# Patient Record
Sex: Female | Born: 1990 | Race: White | Hispanic: No | Marital: Married
Health system: Southern US, Community
[De-identification: ages and names within clinical notes are randomized; demographics above are authoritative.]

## PROBLEM LIST (undated history)

## (undated) HISTORY — PX: FRACTURE SURGERY: SHX138

## (undated) HISTORY — PX: SHOULDER SURGERY: SHX246

---
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
## 2012-11-27 ENCOUNTER — Encounter: Payer: Self-pay | Admitting: Obstetrics and Gynecology

## 2012-12-07 ENCOUNTER — Encounter (HOSPITAL_COMMUNITY): Payer: Self-pay | Admitting: Emergency Medicine

## 2012-12-07 ENCOUNTER — Emergency Department (HOSPITAL_COMMUNITY)
Admission: EM | Admit: 2012-12-07 | Discharge: 2012-12-07 | Disposition: A | Discharge status: Home / Self Care | Payer: No Typology Code available for payment source | Attending: Emergency Medicine | Admitting: Emergency Medicine

## 2012-12-07 DIAGNOSIS — Z23 Encounter for immunization: Secondary | ICD-10-CM | POA: Insufficient documentation

## 2012-12-07 DIAGNOSIS — Y9301 Activity, walking, marching and hiking: Secondary | ICD-10-CM | POA: Insufficient documentation

## 2012-12-07 DIAGNOSIS — Y92410 Unspecified street and highway as the place of occurrence of the external cause: Secondary | ICD-10-CM | POA: Insufficient documentation

## 2012-12-07 DIAGNOSIS — S41109A Unspecified open wound of unspecified upper arm, initial encounter: Secondary | ICD-10-CM | POA: Insufficient documentation

## 2012-12-07 DIAGNOSIS — T148XXA Other injury of unspecified body region, initial encounter: Secondary | ICD-10-CM

## 2012-12-07 DIAGNOSIS — W540XXA Bitten by dog, initial encounter: Secondary | ICD-10-CM | POA: Insufficient documentation

## 2012-12-07 DIAGNOSIS — S8990XA Unspecified injury of unspecified lower leg, initial encounter: Secondary | ICD-10-CM | POA: Insufficient documentation

## 2012-12-07 DIAGNOSIS — Z79899 Other long term (current) drug therapy: Secondary | ICD-10-CM | POA: Insufficient documentation

## 2012-12-07 DIAGNOSIS — S99929A Unspecified injury of unspecified foot, initial encounter: Secondary | ICD-10-CM | POA: Insufficient documentation

## 2012-12-07 MED ORDER — TETANUS-DIPHTH-ACELL PERTUSSIS 5-2.5-18.5 LF-MCG/0.5 IM SUSP
0.5000 mL | Freq: Once | INTRAMUSCULAR | Status: AC
  Administered 2012-12-07: 0.5 mL via INTRAMUSCULAR
  Filled 2012-12-07: qty 0.5

## 2012-12-07 MED ORDER — AMOXICILLIN-POT CLAVULANATE 875-125 MG PO TABS: 1.0000 | ORAL_TABLET | Freq: Two times a day (BID) | ORAL | Status: DC

## 2012-12-07 NOTE — ED Provider Notes (Signed)
History    This chart was scribed for non-physician practitioner working with Jocelyn Roots, MD by Jocelyn Harrison, ED Scribe. This patient was seen in room TR10C/TR10C and the patient's care was started at 2111.   CSN: [id number]  Arrival date & time 12/07/12  2003   First MD Initiated Contact with Patient 12/07/12 2111      Chief Complaint  Patient presents with  . Animal Bite    The history is provided by the patient. No language interpreter was used.   Jocelyn Harrison is a 22 y.o. female who presents to the Emergency Department complaining of sudden onset, constant, left and right hand pain and right leg pain, unchanged since onset, due to being bitten by a neighbor's dog immediately before arrival. Pt states she was walking down the street and the dog ran up and started biting her. She reports the dog has already been detained by [employer]. She states that there was minimal bleeding and denies aggravating or alleviating factors of symptoms. Denies lightheadedness, dizziness, pallor, and numbness or tingling in her extremities.  Date of last tetanus unknown.  History reviewed. No pertinent past medical history.  No past surgical history on file.  No family history on file.  History  Substance Use Topics  . Smoking status: Never Smoker   . Smokeless tobacco: Not on file  . Alcohol Use: No     Review of Systems  Constitutional: Negative for fever, chills and diaphoresis.  Musculoskeletal: Positive for joint swelling.  Skin: Positive for wound. Negative for pallor.  Neurological: Negative for weakness and numbness.  All other systems reviewed and are negative.    Allergies  Lactose intolerance (gi)  Home Medications   Current Outpatient Rx  Name  Route  Sig  Dispense  Refill  . ibuprofen (ADVIL,MOTRIN) 200 MG tablet   Oral   Take 400 mg by mouth every 6 (six) hours as needed for pain.         Marland Kitchen neomycin-bacitracin-polymyxin (NEOSPORIN) ointment   Topical    Apply 1 application topically 2 (two) times daily as needed (to animal bite).         . Norgestimate-Ethinyl Estradiol Triphasic (TRI-PREVIFEM) 0.18/0.215/0.25 MG-35 MCG tablet   Oral   Take 1 tablet by mouth daily.         Marland Kitchen amoxicillin-clavulanate (AUGMENTIN) 875-125 MG per tablet   Oral   Take 1 tablet by mouth every 12 (twelve) hours.   14 tablet   0     BP 140/77  Pulse 92  Temp(Src) 98 F (36.7 C) (Oral)  Resp 14  SpO2 99%  LMP 10/22/2012  Physical Exam  Nursing note and vitals reviewed. Constitutional: She is oriented to person, place, and time. She appears well-developed and well-nourished. No distress.  HENT:  Head: Normocephalic and atraumatic.  Eyes: Conjunctivae and EOM are normal. No scleral icterus.  Neck: Neck supple. No tracheal deviation present.  Cardiovascular: Normal rate, regular rhythm, normal heart sounds and intact distal pulses.   Capillary refill <3 seconds in all extremities.  Pulmonary/Chest: Effort normal and breath sounds normal. No respiratory distress. She has no wheezes. She has no rales.  Musculoskeletal: Normal range of motion.  Full ROM of b/l upper extremities and hands. Patient able to make tight fist and has 5/5 strength against resistance of FDP, FDS, and extensors of all fingers on b/l hands.  Neurological: She is alert and oriented to person, place, and time. She has normal reflexes.  No sensory or motor deficits appreciated.  Skin: Skin is warm and dry. She is not diaphoretic. No pallor.  Superficial punctate markings to proximal aspect of 5th finger on left hand as well as dorsal surface of right hand. Abrasion on tibial aspect of right leg distal to right knee. Mild tenderness on palpation at these bite sites.  Psychiatric: She has a normal mood and affect. Her behavior is normal.    ED Course  Procedures (including critical care time) DIAGNOSTIC STUDIES: Oxygen Saturation is 99% on room air, normal by my interpretation.     COORDINATION OF CARE: 9:36 PM Discussed treatment plan which includes dressing the wound, a tetanus shot, Augmentin, ice and Ibuprofen with pt at bedside and pt agreed to plan.  Advised pt to follow up with Animal Control regarding the dogs rabies status.   Labs Reviewed - No data to display No results found.   1. Animal bite      MDM  Uncomplicated dog bite on L 5th finger, R hand and right knee. Patient neurovascularly intact with full ROM of all extremities and normal, equal muscle strength. Patient given tetanus in ED. Well and nontoxic appearing, in NAD; stable for d/c with PCP follow up and course of Augmentin. Patient and mother instructed to follow up with animal control regarding rabies status of dog to see if rabies tx necessary. Indications for ED return discussed. Patient and mother state comfort and understanding with d/c plan with no unaddressed concerns.   I personally performed the services described in this documentation, which was scribed in my presence. The recorded information has been reviewed and is accurate.          Jocelyn Madura, PA-C 12/09/12 2245

## 2012-12-07 NOTE — ED Notes (Signed)
PT. REPORTS NEIGHBORS' DOG BIT HER AT RIGHT LOWER KNEE AND BILATERAL HANDS THIS EVENING , BITE MARKS NOTED AT RIGHT LOWER KNEE AND BILATERAL HANDS , NO BLEEDING . PT. UNSURE OF HER TETANUS IMMUNIZATION.

## 2012-12-12 ENCOUNTER — Ambulatory Visit: Payer: Self-pay | Admitting: Gynecology

## 2012-12-12 NOTE — ED Provider Notes (Signed)
Medical screening examination/treatment/procedure(s) were performed by non-physician practitioner and as supervising physician I was immediately available for consultation/collaboration.   Suzi Roots, MD 12/12/12 [phone]

## 2012-12-20 ENCOUNTER — Encounter: Payer: Self-pay | Admitting: Gynecology

## 2012-12-20 ENCOUNTER — Ambulatory Visit (INDEPENDENT_AMBULATORY_CARE_PROVIDER_SITE_OTHER): Payer: No Typology Code available for payment source

## 2012-12-20 ENCOUNTER — Ambulatory Visit (INDEPENDENT_AMBULATORY_CARE_PROVIDER_SITE_OTHER): Payer: No Typology Code available for payment source | Admitting: Gynecology

## 2012-12-20 VITALS — BP 116/70 | Ht 64.0 in | Wt 129.0 lb

## 2012-12-20 DIAGNOSIS — N94819 Vulvodynia, unspecified: Secondary | ICD-10-CM | POA: Insufficient documentation

## 2012-12-20 DIAGNOSIS — IMO0001 Reserved for inherently not codable concepts without codable children: Secondary | ICD-10-CM

## 2012-12-20 DIAGNOSIS — N949 Unspecified condition associated with female genital organs and menstrual cycle: Secondary | ICD-10-CM

## 2012-12-20 DIAGNOSIS — R102 Pelvic and perineal pain: Secondary | ICD-10-CM

## 2012-12-20 DIAGNOSIS — Z309 Encounter for contraceptive management, unspecified: Secondary | ICD-10-CM

## 2012-12-20 LAB — URINALYSIS W MICROSCOPIC + REFLEX CULTURE
Bilirubin Urine: NEGATIVE
Casts: NONE SEEN
Crystals: NONE SEEN
Ketones, ur: NEGATIVE mg/dL
Specific Gravity, Urine: 1.01 (ref 1.005–1.030)
pH: 7 (ref 5.0–8.0)

## 2012-12-20 MED ORDER — NORTRIPTYLINE HCL 10 MG PO CAPS: ORAL_CAPSULE | ORAL | Status: DC

## 2012-12-20 MED ORDER — LIDOCAINE HCL 2 % EX GEL: CUTANEOUS | Status: DC | PRN

## 2012-12-20 MED ORDER — ETONOGESTREL-ETHINYL ESTRADIOL 0.12-0.015 MG/24HR VA RING: VAGINAL_RING | VAGINAL | Status: DC

## 2012-12-20 NOTE — Progress Notes (Signed)
Patient is a 22 year old that was referred to our practice as a courtesy of Tomi Bamberger, DNP,FNP-BC as a result of this patient's continued pelvic discomfort. Patient was treated for suspected vaginal infection and had been placed on Flagyl 500 mg twice a day for 10 days and also been given Levaquin 500 mg one tablet daily for 10 days as well. Patient also had been prescribed for vaginal candidiasis fluconazole 150 mg one tablet daily for 5 days.  Patient states that her symptoms are during the initial penetration during intercourse and is also very sensitive and tender on the outside of the vulva. She states that when she voids it hurts but on further questioning it appears when she wipes and not actually true dysuria. She is on a triphasic oral contraceptive pill and is also been using condoms.  Exam: Abdomen: Soft slightly tender suprapubic no rebound or guarding Bartholin urethra Skene glands: Within normal limits Q-tip test external labia minora and majora very sensitive on contact Vagina: No lesions or discharge Cervix: No lesions or discharge Bimanual exam: Somewhat difficult and tender and difficult to assess her adnexa. Rectal exam: Not done  Patient is in the process starting her menses. Urinalysis today was negative with exception of small hemoglobin.  Ultrasound: Uterus measures 7.0 x 4.2 x 2.7 cm with endometrial stripe of 3.9 mm. Both right and left ovary were otherwise normal no masses were noted on either adnexa.  Assessment/plan: Vulvovestibulitis etiology? Condom usage? Progesterone content of oral contraceptive pill? Patient will be switched to the NuvaRing to help with local vaginal moisturization to see this will help with her symptoms. Also this being a lower progesterone. Also prescribed 2% lidocaine and she can apply externally when necessary. She'll be placed on nortriptyline 10 mg by mouth at bedtime and she will return to followup with me one month after she's been on  the NuvaRing. I don't believe this is interstitial cystitis but we'll continue to monitor closely. Would recommend nonlatex condom and to avoid perfumes, chills, or Tylox.

## 2012-12-22 LAB — URINE CULTURE

## 2013-01-23 ENCOUNTER — Ambulatory Visit (INDEPENDENT_AMBULATORY_CARE_PROVIDER_SITE_OTHER): Payer: No Typology Code available for payment source | Admitting: Gynecology

## 2013-01-23 ENCOUNTER — Encounter: Payer: Self-pay | Admitting: Gynecology

## 2013-01-23 VITALS — BP 120/70

## 2013-01-23 DIAGNOSIS — Z309 Encounter for contraceptive management, unspecified: Secondary | ICD-10-CM

## 2013-01-23 MED ORDER — MEDROXYPROGESTERONE ACETATE 150 MG/ML IM SUSP
150.0000 mg | Freq: Once | INTRAMUSCULAR | Status: AC
  Administered 2013-01-23: 150 mg via INTRAMUSCULAR

## 2013-01-23 NOTE — Progress Notes (Signed)
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 She had been on oral contraceptive pills and on recent visit she was switched over to the range to help with local vaginal moisturization to see if this would help her symptoms. She was also prescribed 2% lidocaine to apply when necessary during intercourse. She was prescribed nortriptyline 10 mg at bedtime which she states that this has helped her symptoms. She is here now wishing to have the Depo-Provera injection. We discussed all other contraceptive options. The risks benefits and pros and cons of Depo-Provera injection were discussed. Patient is fully aware that this form of contraception is 99% effective and she needs to come to the office every 3 months for injection. She will also return in November for a full annual exam. Literature information was provided on Depo-Provera. She was instructed to take calcium with vitamin D daily as well.

## 2013-01-23 NOTE — Patient Instructions (Addendum)

## 2013-01-31 ENCOUNTER — Ambulatory Visit: Payer: No Typology Code available for payment source | Admitting: Gynecology

## 2013-04-11 ENCOUNTER — Telehealth: Payer: Self-pay | Admitting: *Deleted

## 2013-04-11 MED ORDER — MEDROXYPROGESTERONE ACETATE 150 MG/ML IM SUSP: 150.0000 mg | Freq: Once | INTRAMUSCULAR | Status: DC

## 2013-04-11 NOTE — Telephone Encounter (Signed)
Pt called requesting refill for depo provera, pt has shot due today , rx sent.

## 2013-04-12 ENCOUNTER — Ambulatory Visit (INDEPENDENT_AMBULATORY_CARE_PROVIDER_SITE_OTHER): Payer: No Typology Code available for payment source | Admitting: *Deleted

## 2013-04-12 DIAGNOSIS — Z3049 Encounter for surveillance of other contraceptives: Secondary | ICD-10-CM

## 2013-04-12 MED ORDER — MEDROXYPROGESTERONE ACETATE 150 MG/ML IM SUSP
150.0000 mg | Freq: Once | INTRAMUSCULAR | Status: AC
  Administered 2013-04-12: 150 mg via INTRAMUSCULAR

## 2013-07-02 ENCOUNTER — Ambulatory Visit (INDEPENDENT_AMBULATORY_CARE_PROVIDER_SITE_OTHER): Payer: No Typology Code available for payment source | Admitting: Anesthesiology

## 2013-07-02 ENCOUNTER — Telehealth: Payer: Self-pay | Admitting: *Deleted

## 2013-07-02 DIAGNOSIS — Z309 Encounter for contraceptive management, unspecified: Secondary | ICD-10-CM

## 2013-07-02 MED ORDER — MEDROXYPROGESTERONE ACETATE 150 MG/ML IM SUSP
150.0000 mg | Freq: Once | INTRAMUSCULAR | Status: AC
  Administered 2013-07-02: 150 mg via INTRAMUSCULAR

## 2013-07-02 NOTE — Telephone Encounter (Signed)
Pt called requesting refill on depo provera shot, I called and left message on pt voicemail that refill are at pharmacy. Last filled on 04/11/13 with 3 refills.

## 2013-09-27 ENCOUNTER — Ambulatory Visit (INDEPENDENT_AMBULATORY_CARE_PROVIDER_SITE_OTHER): Payer: No Typology Code available for payment source | Admitting: Anesthesiology

## 2013-09-27 DIAGNOSIS — Z309 Encounter for contraceptive management, unspecified: Secondary | ICD-10-CM

## 2013-09-27 MED ORDER — MEDROXYPROGESTERONE ACETATE 150 MG/ML IM SUSP
150.0000 mg | Freq: Once | INTRAMUSCULAR | Status: AC
  Administered 2013-09-27: 150 mg via INTRAMUSCULAR

## 2013-10-26 ENCOUNTER — Other Ambulatory Visit (HOSPITAL_COMMUNITY)
Admission: RE | Admit: 2013-10-26 | Discharge: 2013-10-26 | Disposition: A | Discharge status: Home / Self Care | Payer: No Typology Code available for payment source | Source: Ambulatory Visit | Attending: Gynecology | Admitting: Gynecology

## 2013-10-26 ENCOUNTER — Encounter: Payer: Self-pay | Admitting: Gynecology

## 2013-10-26 ENCOUNTER — Ambulatory Visit (INDEPENDENT_AMBULATORY_CARE_PROVIDER_SITE_OTHER): Payer: No Typology Code available for payment source | Admitting: Gynecology

## 2013-10-26 VITALS — BP 118/72 | Ht 64.75 in | Wt 135.8 lb

## 2013-10-26 DIAGNOSIS — B9689 Other specified bacterial agents as the cause of diseases classified elsewhere: Secondary | ICD-10-CM

## 2013-10-26 DIAGNOSIS — Z01419 Encounter for gynecological examination (general) (routine) without abnormal findings: Secondary | ICD-10-CM | POA: Insufficient documentation

## 2013-10-26 DIAGNOSIS — Z833 Family history of diabetes mellitus: Secondary | ICD-10-CM

## 2013-10-26 DIAGNOSIS — A499 Bacterial infection, unspecified: Secondary | ICD-10-CM

## 2013-10-26 DIAGNOSIS — N760 Acute vaginitis: Secondary | ICD-10-CM

## 2013-10-26 DIAGNOSIS — Z113 Encounter for screening for infections with a predominantly sexual mode of transmission: Secondary | ICD-10-CM

## 2013-10-26 DIAGNOSIS — Z23 Encounter for immunization: Secondary | ICD-10-CM

## 2013-10-26 LAB — CBC WITH DIFFERENTIAL/PLATELET
Basophils Absolute: 0 10*3/uL (ref 0.0–0.1)
Basophils Relative: 0 % (ref 0–1)
EOS PCT: 1 % (ref 0–5)
Eosinophils Absolute: 0.1 10*3/uL (ref 0.0–0.7)
HEMATOCRIT: 41.1 % (ref 36.0–46.0)
HEMOGLOBIN: 14.5 g/dL (ref 12.0–15.0)
LYMPHS ABS: 2.7 10*3/uL (ref 0.7–4.0)
LYMPHS PCT: 33 % (ref 12–46)
MCH: 33 pg (ref 26.0–34.0)
MCHC: 35.3 g/dL (ref 30.0–36.0)
MCV: 93.4 fL (ref 78.0–100.0)
MONOS PCT: 4 % (ref 3–12)
Monocytes Absolute: 0.3 10*3/uL (ref 0.1–1.0)
Neutro Abs: 5.1 10*3/uL (ref 1.7–7.7)
Neutrophils Relative %: 62 % (ref 43–77)
PLATELETS: 320 10*3/uL (ref 150–400)
RBC: 4.4 MIL/uL (ref 3.87–5.11)
RDW: 12.6 % (ref 11.5–15.5)
WBC: 8.2 10*3/uL (ref 4.0–10.5)

## 2013-10-26 LAB — WET PREP FOR TRICH, YEAST, CLUE
TRICH WET PREP: NONE SEEN
Yeast Wet Prep HPF POC: NONE SEEN

## 2013-10-26 LAB — HEMOGLOBIN A1C
Hgb A1c MFr Bld: 4.8 % (ref <5.7)
Mean Plasma Glucose: 91 mg/dL (ref <117)

## 2013-10-26 MED ORDER — METRONIDAZOLE 500 MG PO TABS: 500.0000 mg | ORAL_TABLET | Freq: Two times a day (BID) | ORAL | Status: DC

## 2013-10-26 NOTE — Patient Instructions (Addendum)
Breast Self-Awareness Practicing breast self-awareness may pick up problems early, prevent significant medical complications, and possibly save your life. By practicing breast self-awareness, you can become familiar with how your breasts look and feel and if your breasts are changing. This allows you to notice changes early. It can also offer you some reassurance that your breast health is good. One way to learn what is normal for your breasts and whether your breasts are changing is to do a breast self-exam. If you find a lump or something that was not present in the past, it is best to contact your caregiver right away. Other findings that should be evaluated by your caregiver include nipple discharge, especially if it is bloody; skin changes or reddening; areas where the skin seems to be pulled in (retracted); or new lumps and bumps. Breast pain is seldom associated with cancer (malignancy), but should also be evaluated by a caregiver. HOW TO PERFORM A BREAST SELF-EXAM The best time to examine your breasts is 5 7 days after your menstrual period is over. During menstruation, the breasts are lumpier, and it may be more difficult to pick up changes. If you do not menstruate, have reached menopause, or had your uterus removed (hysterectomy), you should examine your breasts at regular intervals, such as monthly. If you are breastfeeding, examine your breasts after a feeding or after using a breast pump. Breast implants do not decrease the risk for lumps or tumors, so continue to perform breast self-exams as recommended. Talk to your caregiver about how to determine the difference between the implant and breast tissue. Also, talk about the amount of pressure you should use during the exam. Over time, you will become more familiar with the variations of your breasts and more comfortable with the exam. A breast self-exam requires you to remove all your clothes above the waist. 1. Look at your breasts and nipples.  Stand in front of a mirror in a room with good lighting. With your hands on your hips, push your hands firmly downward. Look for a difference in shape, contour, and size from one breast to the other (asymmetry). Asymmetry includes puckers, dips, or bumps. Also, look for skin changes, such as reddened or scaly areas on the breasts. Look for nipple changes, such as discharge, dimpling, repositioning, or redness. 2. Carefully feel your breasts. This is best done either in the shower or tub while using soapy water or when flat on your back. Place the arm (on the side of the breast you are examining) above your head. Use the pads (not the fingertips) of your three middle fingers on your opposite hand to feel your breasts. Start in the underarm area and use  inch (2 cm) overlapping circles to feel your breast. Use 3 different levels of pressure (light, medium, and firm pressure) at each circle before moving to the next circle. The light pressure is needed to feel the tissue closest to the skin. The medium pressure will help to feel breast tissue a little deeper, while the firm pressure is needed to feel the tissue close to the ribs. Continue the overlapping circles, moving downward over the breast until you feel your ribs below your breast. Then, move one finger-width towards the center of the body. Continue to use the  inch (2 cm) overlapping circles to feel your breast as you move slowly up toward the collar bone (clavicle) near the base of the neck. Continue the up and down exam using all 3 pressures until you reach   the middle of the chest. Do this with each breast, carefully feeling for lumps or changes. 3.  Keep a written record with breast changes or normal findings for each breast. By writing this information down, you do not need to depend only on memory for size, tenderness, or location. Write down where you are in your menstrual cycle, if you are still menstruating. Breast tissue can have some lumps or  thick tissue. However, see your caregiver if you find anything that concerns you.  SEEK MEDICAL CARE IF:  You see a change in shape, contour, or size of your breasts or nipples.   You see skin changes, such as reddened or scaly areas on the breasts or nipples.   You have an unusual discharge from your nipples.   You feel a new lump or unusually thick areas.  Document Released: 08/16/2005 Document Revised: 08/02/2012 Document Reviewed: 12/01/2011 [facility] Patient Information 2014 [place], [place]. Human Papillomavirus (HPV) Gardasil Vaccine What You Need to Know WHAT IS HPV?  Genital human papillomavirus (HPV) is the most common sexually transmitted virus in the [place]. More than half of sexually active men and women are infected with HPV at some time in their lives.  About 20 million Americans are currently infected, and about 6 million more get infected each year. HPV is usually spread through sexual contact.  Most HPV infections do not cause any symptoms and go away on their own. But HPV can cause cervical cancer in women. Cervical cancer is the 2nd leading cause of cancer deaths among women around the world. In the [place], about 12,000 women get cervical cancer every year and about 4,000 are expected to die from it.  HPV is also associated with several less common cancers, such as vaginal and vulvar cancers in women, and anal and oropharyngeal (back of the throat, including base of tongue and tonsils) cancers in both men and women. HPV can also cause genital warts and warts in the throat.  There is no cure for HPV infection, but some of the problems it causes can be treated. HPV VACCINE: WHY GET VACCINATED?  The HPV vaccine you are getting is 1 of 2 vaccines that can be given to prevent HPV. It may be given to both males and females.  This vaccine can prevent most cases of cervical cancer in females, if it is given before exposure to the virus. In addition, it can  prevent vaginal and vulvar cancer in females, and genital warts and anal cancer in both males and females.  Protection from HPV vaccine is expected to be long-lasting. But vaccination is not a substitute for cervical cancer screening. Women should still get regular Pap tests. WHO SHOULD GET THIS HPV VACCINE AND WHEN? HPV vaccine is given as a 3-dose series.  1st Dose: Now.  2nd Dose: 1 to 2 months after Dose 1.  3rd Dose: 6 months after Dose 1. Additional (booster) doses are not recommended. Routine Vaccination This HPV vaccine is recommended for girls and boys 60 or 22 years of age. It may be given starting at age 63. Why is HPV vaccine recommended at 72 or 22 years of age?  HPV infection is easily acquired, even with only one sex partner. That is why it is important to get HPV vaccine before any sexual contact takes place. Also, response to the vaccine is better at this age than at older ages. Catch-Up Vaccination This vaccine is recommended for the following people who have not completed the 3-dose  series:   Females 13 through 22 years of age.  Males 13 through 23 years of age. This vaccine may be given to men 22 through 22 years of age who have not completed the 3-dose series. It is recommended for men through age 31 who have sex with men or whose immune system is weakened because of HIV infection, other illness, or medications.  HPV vaccine may be given at the same time as other vaccines. SOME PEOPLE SHOULD NOT GET HPV VACCINE OR SHOULD WAIT  Anyone who has ever had a life-threatening allergic reaction to any other component of HPV vaccine, or to a previous dose of HPV vaccine, should not get the vaccine. Tell your doctor if the person getting vaccinated has any severe allergies, including an allergy to yeast.  HPV vaccine is not recommended for pregnant women. However, receiving HPV vaccine when pregnant is not a reason to consider terminating the pregnancy. Women who are  breastfeeding may get the vaccine.  People who are mildly ill when a dose of HPV is planned can still be vaccinated. People with a moderate or severe illness should wait until they are better. WHAT ARE THE RISKS FROM THIS VACCINE?  This HPV vaccine has been used in the [place] and around the world for about 6 years and has been very safe.  However, any medicine could possibly cause a serious problem, such as a severe allergic reaction. The risk of any vaccine causing a serious injury, or death, is extremely small.  Life-threatening allergic reactions from vaccines are very rare. If they do occur, it would be within a few minutes to a few hours after the vaccination. Several mild to moderate problems are known to occur with HPV vaccine. These do not last long and go away on their own.  Reactions in the arm where the shot was given:  Pain (about 8 people in 10).  Redness or swelling (about 1 person in 4).  Fever:  Mild (100 F or 37.8 C) (about 1 person in 10).  Moderate (102 F or 38.9 C) (about 1 person in 51).  Other problems:  Headache (about 1 person in 3).  Fainting: Brief fainting spells and related symptoms (such as jerking movements) can happen after any medical procedure, including vaccination. Sitting or lying down for about 15 minutes after a vaccination can help prevent fainting and injuries caused by falls. Tell your doctor if the patient feels dizzy or lightheaded, or has vision changes or ringing in the ears.  Like all vaccines, HPV vaccines will continue to be monitored for unusual or severe problems. WHAT IF THERE IS A SERIOUS REACTION? What should I look for?  Any unusual condition, such as a high fever or unusual behavior. Signs of a serious allergic reaction can include difficulty breathing, hoarseness or wheezing, hives, paleness, weakness, a fast heartbeat, or dizziness. What should I do?  Call a doctor, or get the person to a doctor right away.  Tell your  doctor what happened, the date and time it happened, and when the vaccination was given.  Ask your doctor, nurse, or health department to report the reaction by filing a Vaccine Adverse Event Reporting System (VAERS) form. Or, you can file this report through the VAERS website at www.vaers.LAgents.no or by calling 1-680-224-5925. VAERS does not provide medical advice. THE NATIONAL VACCINE INJURY COMPENSATION PROGRAM  The National Vaccine Injury Compensation Program (VICP) is a federal program that was created to compensate people who may have been injured by  certain vaccines.  Persons who believe they may have been injured by a vaccine can learn about the program and about filing a claim by calling 1-626-007-9689 or visiting the VICP website at SpiritualWord.atwww.hrsa.gov/vaccinecompensation HOW CAN I LEARN MORE?  Ask your doctor.  Call your local or state health department.  Contact the Centers for Disease Control and Prevention (CDC):  Call 579-048-29151-575-394-5772 (1-800-CDC-INFO)  or  Visit CDC's website at PicCapture.uywww.cdc.gov/vaccines CDC Human Papillomavirus (HPV) Gardasil (Interim) 01/14/12 Document Released: 06/13/2006 Document Revised: 06/06/2013 Document Reviewed: 12/05/2012 [facility] Patient Information 2014 [place]. Bacterial Vaginosis Bacterial vaginosis is a vaginal infection that occurs when the normal balance of bacteria in the vagina is disrupted. It results from an overgrowth of certain bacteria. This is the most common vaginal infection in women of childbearing age. Treatment is important to prevent complications, especially in pregnant women, as it can cause a premature delivery. CAUSES  Bacterial vaginosis is caused by an increase in harmful bacteria that are normally present in smaller amounts in the vagina. Several different kinds of bacteria can cause bacterial vaginosis. However, the reason that the condition develops is not fully understood. RISK FACTORS Certain activities or behaviors can  put you at an increased risk of developing bacterial vaginosis, including:  Having a new sex partner or multiple sex partners.  Douching.  Using an intrauterine device (IUD) for contraception. Women do not get bacterial vaginosis from toilet seats, bedding, swimming pools, or contact with objects around them. SIGNS AND SYMPTOMS  Some women with bacterial vaginosis have no signs or symptoms. Common symptoms include:  Grey vaginal discharge.  A fishlike odor with discharge, especially after sexual intercourse.  Itching or burning of the vagina and vulva.  Burning or pain with urination. DIAGNOSIS  Your health care provider will take a medical history and examine the vagina for signs of bacterial vaginosis. A sample of vaginal fluid may be taken. Your health care provider will look at this sample under a microscope to check for bacteria and abnormal cells. A vaginal pH test may also be done.  TREATMENT  Bacterial vaginosis may be treated with antibiotic medicines. These may be given in the form of a pill or a vaginal cream. A second round of antibiotics may be prescribed if the condition comes back after treatment.  HOME CARE INSTRUCTIONS   Only take over-the-counter or prescription medicines as directed by your health care provider.  If antibiotic medicine was prescribed, take it as directed. Make sure you finish it even if you start to feel better.  Do not have sex until treatment is completed.  Tell all sexual partners that you have a vaginal infection. They should see their health care provider and be treated if they have problems, such as a mild rash or itching.  Practice safe sex by using condoms and only having one sex partner. SEEK MEDICAL CARE IF:   Your symptoms are not improving after 3 days of treatment.  You have increased discharge or pain.  You have a fever. MAKE SURE YOU:   Understand these instructions.  Will watch your condition.  Will get help right away  if you are not doing well or get worse. FOR MORE INFORMATION  Centers for Disease Control and Prevention, Division of STD Prevention: SolutionApps.co.zawww.cdc.gov/std American Sexual Health Association (ASHA): www.ashastd.org  Document Released: 08/16/2005 Document Revised: 06/06/2013 Document Reviewed: 03/28/2013 [facility] Patient Information 2014 [place], [place].

## 2013-10-26 NOTE — Addendum Note (Signed)
Addended by: Bertram SavinGONZALEZ-Chassidy Layson A on: 10/26/2013 11:27 AM   Modules accepted: Orders

## 2013-10-26 NOTE — Progress Notes (Signed)
Jocelyn Harrison 1991/02/18 [id number]   History:    23 y.o.  for annual gyn exam with no complaints today. Patient is on the Depo-Provera injectable contraception which were started in 2014. She is due for her next injection in April of this year. Patient has never had a Pap smear before. Patient has not received the HPV vaccine. Patient interested in STD screen today. Patient states she had an HIV test 2 years ago which was negative.  Past medical history,surgical history, family history and social history were all reviewed and documented in the EPIC chart.  Gynecologic History No LMP recorded. Patient has had an injection. Contraception: Depo-Provera injections Last Pap: No prior study. Results were: No prior study Last mammogram: That indicated. Results were: Not indicated  Obstetric History OB History  Gravida Para Term Preterm AB SAB TAB Ectopic Multiple Living  0                  ROS: A ROS was performed and pertinent positives and negatives are included in the history.  GENERAL: No fevers or chills. HEENT: No change in vision, no earache, sore throat or sinus congestion. NECK: No pain or stiffness. CARDIOVASCULAR: No chest pain or pressure. No palpitations. PULMONARY: No shortness of breath, cough or wheeze. GASTROINTESTINAL: No abdominal pain, nausea, vomiting or diarrhea, melena or bright red blood per rectum. GENITOURINARY: No urinary frequency, urgency, hesitancy or dysuria. MUSCULOSKELETAL: No joint or muscle pain, no back pain, no recent trauma. DERMATOLOGIC: No rash, no itching, no lesions. ENDOCRINE: No polyuria, polydipsia, no heat or cold intolerance. No recent change in weight. HEMATOLOGICAL: No anemia or easy bruising or bleeding. NEUROLOGIC: No headache, seizures, numbness, tingling or weakness. PSYCHIATRIC: No depression, no loss of interest in normal activity or change in sleep pattern.     Exam: chaperone present  BP 118/72  Ht 5' 4.75" (1.645 m)  Wt 135 lb 12.8  oz (61.598 kg)  BMI 22.76 kg/m2  Body mass index is 22.76 kg/(m^2).  General appearance : Well developed well nourished female. No acute distress HEENT: Neck supple, trachea midline, no carotid bruits, no thyroidmegaly Lungs: Clear to auscultation, no rhonchi or wheezes, or rib retractions  Heart: Regular rate and rhythm, no murmurs or gallops Breast:Examined in sitting and supine position were symmetrical in appearance, no palpable masses or tenderness,  no skin retraction, no nipple inversion, no nipple discharge, no skin discoloration, no axillary or supraclavicular lymphadenopathy Abdomen: no palpable masses or tenderness, no rebound or guarding Extremities: no edema or skin discoloration or tenderness  Pelvic:  Bartholin, Urethra, Skene Glands: Within normal limits             Vagina: No gross lesions or discharge  Cervix: No gross lesions or discharge  Uterus  anteverted, normal size, shape and consistency, non-tender and mobile  Adnexa  Without masses or tenderness  Anus and perineum  normal   Rectovaginal  normal sphincter tone without palpated masses or tenderness             Hemoccult that indicated   GC and chlamydia culture pending Wet prep: Few clue cells and many bacteria were noted  Assessment/Plan:  23 y.o. female for annual exam will be treated for bacterial vaginosis with Flagyl 500 mg 1 by mouth twice a day for 7 days. Patient was counseled that ligature formation was provided and she received today the first of a series of 3 of the HPV vaccine. When she returns back in April  for her next Depo-Provera injection she will receive a second dose of the HPV and the third one 6 months from now. She will stop by the lab we'll do a CBC along with a hemoglobin A1c and urinalysis. Pap smear without HPV screen according to the guidelines were done today. Patient was reminded to do her monthly breast exam.  Note: This dictation was prepared with  Dragon/digital dictation along  withSmart phrase technology. Any transcriptional errors that result from this process are unintentional.   Ok EdwardsFERNANDEZ,JUAN H MD, 11:08 AM 10/26/2013

## 2013-10-27 LAB — URINALYSIS W MICROSCOPIC + REFLEX CULTURE
Bilirubin Urine: NEGATIVE
CASTS: NONE SEEN
CRYSTALS: NONE SEEN
GLUCOSE, UA: NEGATIVE mg/dL
KETONES UR: NEGATIVE mg/dL
LEUKOCYTES UA: NEGATIVE
NITRITE: POSITIVE — AB
PH: 6.5 (ref 5.0–8.0)
Protein, ur: NEGATIVE mg/dL
SPECIFIC GRAVITY, URINE: 1.023 (ref 1.005–1.030)
Urobilinogen, UA: 0.2 mg/dL (ref 0.0–1.0)

## 2013-10-27 LAB — GC/CHLAMYDIA PROBE AMP
CT Probe RNA: NEGATIVE
GC Probe RNA: NEGATIVE

## 2013-10-29 ENCOUNTER — Other Ambulatory Visit: Payer: Self-pay | Admitting: Gynecology

## 2013-10-29 LAB — URINE CULTURE: Colony Count: >=100000

## 2013-10-29 MED ORDER — NITROFURANTOIN MONOHYD MACRO 100 MG PO CAPS: 100.0000 mg | ORAL_CAPSULE | Freq: Two times a day (BID) | ORAL | Status: DC

## 2013-12-19 ENCOUNTER — Ambulatory Visit (INDEPENDENT_AMBULATORY_CARE_PROVIDER_SITE_OTHER): Payer: No Typology Code available for payment source | Admitting: Anesthesiology

## 2013-12-19 DIAGNOSIS — Z309 Encounter for contraceptive management, unspecified: Secondary | ICD-10-CM

## 2013-12-19 DIAGNOSIS — Z23 Encounter for immunization: Secondary | ICD-10-CM

## 2013-12-19 MED ORDER — MEDROXYPROGESTERONE ACETATE 150 MG/ML IM SUSP
150.0000 mg | Freq: Once | INTRAMUSCULAR | Status: AC
  Administered 2013-12-19: 150 mg via INTRAMUSCULAR

## 2013-12-21 ENCOUNTER — Ambulatory Visit: Payer: No Typology Code available for payment source

## 2014-03-06 ENCOUNTER — Other Ambulatory Visit: Payer: Self-pay | Admitting: Gynecology

## 2014-03-06 ENCOUNTER — Ambulatory Visit: Payer: No Typology Code available for payment source

## 2014-03-07 ENCOUNTER — Ambulatory Visit (INDEPENDENT_AMBULATORY_CARE_PROVIDER_SITE_OTHER): Payer: No Typology Code available for payment source | Admitting: Anesthesiology

## 2014-03-07 DIAGNOSIS — Z3049 Encounter for surveillance of other contraceptives: Secondary | ICD-10-CM

## 2014-03-07 DIAGNOSIS — Z3042 Encounter for surveillance of injectable contraceptive: Secondary | ICD-10-CM

## 2014-03-07 MED ORDER — MEDROXYPROGESTERONE ACETATE 150 MG/ML IM SUSP
150.0000 mg | Freq: Once | INTRAMUSCULAR | Status: AC
  Administered 2014-03-07: 150 mg via INTRAMUSCULAR

## 2014-03-14 ENCOUNTER — Ambulatory Visit (INDEPENDENT_AMBULATORY_CARE_PROVIDER_SITE_OTHER): Payer: No Typology Code available for payment source | Admitting: Women's Health

## 2014-03-14 ENCOUNTER — Encounter: Payer: Self-pay | Admitting: Women's Health

## 2014-03-14 DIAGNOSIS — Z23 Encounter for immunization: Secondary | ICD-10-CM

## 2014-03-14 DIAGNOSIS — B3731 Acute candidiasis of vulva and vagina: Secondary | ICD-10-CM

## 2014-03-14 DIAGNOSIS — B373 Candidiasis of vulva and vagina: Secondary | ICD-10-CM

## 2014-03-14 DIAGNOSIS — Z113 Encounter for screening for infections with a predominantly sexual mode of transmission: Secondary | ICD-10-CM

## 2014-03-14 LAB — HEPATITIS B SURFACE ANTIGEN: Hepatitis B Surface Ag: NEGATIVE

## 2014-03-14 LAB — HEPATITIS C ANTIBODY: HCV Ab: NEGATIVE

## 2014-03-14 LAB — WET PREP FOR TRICH, YEAST, CLUE
CLUE CELLS WET PREP: NONE SEEN
TRICH WET PREP: NONE SEEN

## 2014-03-14 LAB — RPR

## 2014-03-14 LAB — HIV ANTIBODY (ROUTINE TESTING W REFLEX): HIV 1&2 Ab, 4th Generation: NONREACTIVE

## 2014-03-14 MED ORDER — FLUCONAZOLE 150 MG PO TABS: 150.0000 mg | ORAL_TABLET | Freq: Once | ORAL | Status: DC

## 2014-03-14 NOTE — Progress Notes (Signed)
Patient ID: Jocelyn Harrison, female   DOB: [birth year], 23 y.o.   MRN: [id number] Presents with vaginal discharge with itching for several days and requesting STD screen. Same partner. Contraceptives with Depo-Provera with minimal breakthrough bleeding. Denies urinary symptoms, abdominal pain or fever. Due for third gardasil.   Exam: Appears well. External genitalia erythematous at introitus, speculum exam moderate amount of a white discharge noted, wet prep positive for yeast. GC/Chlamydia culture taken. Bimanual no CMT or adnexal fullness or tenderness.  Yeast vaginitis STD screen  Plan: Diflucan 150 by mouth times one dose with 1 refill. Yeast prevention discussed. GC/Chlamydia culture pending, HIV, hep B, C., RPR. Condoms encouraged until permanent partner. Third gardasil given.

## 2014-03-14 NOTE — Patient Instructions (Signed)

## 2014-03-18 LAB — GC/CHLAMYDIA PROBE AMP
CT PROBE, AMP APTIMA: NEGATIVE
GC Probe RNA: NEGATIVE

## 2014-09-18 ENCOUNTER — Ambulatory Visit: Payer: No Typology Code available for payment source | Admitting: Women's Health

## 2014-10-23 ENCOUNTER — Encounter: Payer: Self-pay | Admitting: Women's Health

## 2014-10-23 ENCOUNTER — Ambulatory Visit (INDEPENDENT_AMBULATORY_CARE_PROVIDER_SITE_OTHER): Payer: 59 | Admitting: Women's Health

## 2014-10-23 ENCOUNTER — Other Ambulatory Visit: Payer: Self-pay | Admitting: Gynecology

## 2014-10-23 ENCOUNTER — Telehealth: Payer: Self-pay | Admitting: Gynecology

## 2014-10-23 VITALS — BP 132/80 | Ht 64.0 in | Wt 140.0 lb

## 2014-10-23 DIAGNOSIS — N912 Amenorrhea, unspecified: Secondary | ICD-10-CM

## 2014-10-23 DIAGNOSIS — Z833 Family history of diabetes mellitus: Secondary | ICD-10-CM

## 2014-10-23 DIAGNOSIS — Z30431 Encounter for routine checking of intrauterine contraceptive device: Secondary | ICD-10-CM

## 2014-10-23 DIAGNOSIS — Z01419 Encounter for gynecological examination (general) (routine) without abnormal findings: Secondary | ICD-10-CM

## 2014-10-23 LAB — CBC WITH DIFFERENTIAL/PLATELET
BASOS PCT: 0 % (ref 0–1)
Basophils Absolute: 0 10*3/uL (ref 0.0–0.1)
EOS ABS: 0.1 10*3/uL (ref 0.0–0.7)
EOS PCT: 1 % (ref 0–5)
HCT: 41.5 % (ref 36.0–46.0)
HEMOGLOBIN: 14.2 g/dL (ref 12.0–15.0)
LYMPHS ABS: 3.3 10*3/uL (ref 0.7–4.0)
LYMPHS PCT: 40 % (ref 12–46)
MCH: 32.6 pg (ref 26.0–34.0)
MCHC: 34.2 g/dL (ref 30.0–36.0)
MCV: 95.4 fL (ref 78.0–100.0)
MPV: 10.1 fL (ref 8.6–12.4)
Monocytes Absolute: 0.5 10*3/uL (ref 0.1–1.0)
Monocytes Relative: 6 % (ref 3–12)
NEUTROS ABS: 4.3 10*3/uL (ref 1.7–7.7)
NEUTROS PCT: 53 % (ref 43–77)
PLATELETS: 290 10*3/uL (ref 150–400)
RBC: 4.35 MIL/uL (ref 3.87–5.11)
RDW: 12.4 % (ref 11.5–15.5)
WBC: 8.2 10*3/uL (ref 4.0–10.5)

## 2014-10-23 LAB — GLUCOSE, RANDOM: Glucose, Bld: 87 mg/dL (ref 70–99)

## 2014-10-23 MED ORDER — LEVONORGESTREL 20 MCG/24HR IU IUD: INTRAUTERINE_SYSTEM | Freq: Once | INTRAUTERINE | Status: DC

## 2014-10-23 NOTE — Telephone Encounter (Signed)
10/23/14-I LM VM for pt that the Mirena & insertion are covered at 100%, no copay, for contraception [facility]. She already has an appt for insertion with [email or web address]

## 2014-10-23 NOTE — Patient Instructions (Signed)
Health Maintenance Adopting a healthy lifestyle and getting preventive care can go a long way to promote health and wellness. Talk with your health care provider about what schedule of regular examinations is right for you. This is a good chance for you to check in with your provider about disease prevention and staying healthy. In between checkups, there are plenty of things you can do on your own. Experts have done a lot of research about which lifestyle changes and preventive measures are most likely to keep you healthy. Ask your health care provider for more information. WEIGHT AND DIET  Eat a healthy diet  Be sure to include plenty of vegetables, fruits, low-fat dairy products, and lean protein.  Do not eat a lot of foods high in solid fats, added sugars, or salt.  Get regular exercise. This is one of the most important things you can do for your health.  Most adults should exercise for at least 150 minutes each week. The exercise should increase your heart rate and make you sweat (moderate-intensity exercise).  Most adults should also do strengthening exercises at least twice a week. This is in addition to the moderate-intensity exercise.  Maintain a healthy weight  Body mass index (BMI) is a measurement that can be used to identify possible weight problems. It estimates body fat based on height and weight. Your health care provider can help determine your BMI and help you achieve or maintain a healthy weight.  For females 25 years of age and older:   A BMI below 18.5 is considered underweight.  A BMI of 18.5 to 24.9 is normal.  A BMI of 25 to 29.9 is considered overweight.  A BMI of 30 and above is considered obese.  Watch levels of cholesterol and blood lipids  You should start having your blood tested for lipids and cholesterol at 23 years of age, then have this test every 5 years.  You may need to have your cholesterol levels checked more often if:  Your lipid or  cholesterol levels are high.  You are older than 23 years of age.  You are at high risk for heart disease.  CANCER SCREENING   Lung Cancer  Lung cancer screening is recommended for adults 97-92 years old who are at high risk for lung cancer because of a history of smoking.  A yearly low-dose CT scan of the lungs is recommended for people who:  Currently smoke.  Have quit within the past 15 years.  Have at least a 30-pack-year history of smoking. A pack year is smoking an average of one pack of cigarettes a day for 1 year.  Yearly screening should continue until it has been 15 years since you quit.  Yearly screening should stop if you develop a health problem that would prevent you from having lung cancer treatment.  Breast Cancer  Practice breast self-awareness. This means understanding how your breasts normally appear and feel.  It also means doing regular breast self-exams. Let your health care provider know about any changes, no matter how small.  If you are in your 20s or 30s, you should have a clinical breast exam (CBE) by a health care provider every 1-3 years as part of a regular health exam.  If you are 76 or older, have a CBE every year. Also consider having a breast X-ray (mammogram) every year.  If you have a family history of breast cancer, talk to your health care provider about genetic screening.  If you are  at high risk for breast cancer, talk to your health care provider about having an MRI and a mammogram every year.  Breast cancer gene (BRCA) assessment is recommended for women who have family members with BRCA-related cancers. BRCA-related cancers include:  Breast.  Ovarian.  Tubal.  Peritoneal cancers.  Results of the assessment will determine the need for genetic counseling and BRCA1 and BRCA2 testing. Cervical Cancer Routine pelvic examinations to screen for cervical cancer are no longer recommended for nonpregnant women who are considered low  risk for cancer of the pelvic organs (ovaries, uterus, and vagina) and who do not have symptoms. A pelvic examination may be necessary if you have symptoms including those associated with pelvic infections. Ask your health care provider if a screening pelvic exam is right for you.   The Pap test is the screening test for cervical cancer for women who are considered at risk.  If you had a hysterectomy for a problem that was not cancer or a condition that could lead to cancer, then you no longer need Pap tests.  If you are older than 65 years, and you have had normal Pap tests for the past 10 years, you no longer need to have Pap tests.  If you have had past treatment for cervical cancer or a condition that could lead to cancer, you need Pap tests and screening for cancer for at least 20 years after your treatment.  If you no longer get a Pap test, assess your risk factors if they change (such as having a new sexual partner). This can affect whether you should start being screened again.  Some women have medical problems that increase their chance of getting cervical cancer. If this is the case for you, your health care provider may recommend more frequent screening and Pap tests.  The human papillomavirus (HPV) test is another test that may be used for cervical cancer screening. The HPV test looks for the virus that can cause cell changes in the cervix. The cells collected during the Pap test can be tested for HPV.  The HPV test can be used to screen women 30 years of age and older. Getting tested for HPV can extend the interval between normal Pap tests from three to five years.  An HPV test also should be used to screen women of any age who have unclear Pap test results.  After 23 years of age, women should have HPV testing as often as Pap tests.  Colorectal Cancer  This type of cancer can be detected and often prevented.  Routine colorectal cancer screening usually begins at 23 years of  age and continues through 23 years of age.  Your health care provider may recommend screening at an earlier age if you have risk factors for colon cancer.  Your health care provider may also recommend using home test kits to check for hidden blood in the stool.  A small camera at the end of a tube can be used to examine your colon directly (sigmoidoscopy or colonoscopy). This is done to check for the earliest forms of colorectal cancer.  Routine screening usually begins at age 50.  Direct examination of the colon should be repeated every 5-10 years through 23 years of age. However, you may need to be screened more often if early forms of precancerous polyps or small growths are found. Skin Cancer  Check your skin from head to toe regularly.  Tell your health care provider about any new moles or changes in   moles, especially if there is a change in a mole's shape or color.  Also tell your health care provider if you have a mole that is larger than the size of a pencil eraser.  Always use sunscreen. Apply sunscreen liberally and repeatedly throughout the day.  Protect yourself by wearing long sleeves, pants, a wide-brimmed hat, and sunglasses whenever you are outside. HEART DISEASE, DIABETES, AND HIGH BLOOD PRESSURE   Have your blood pressure checked at least every 1-2 years. High blood pressure causes heart disease and increases the risk of stroke.  If you are between 75 years and 42 years old, ask your health care provider if you should take aspirin to prevent strokes.  Have regular diabetes screenings. This involves taking a blood sample to check your fasting blood sugar level.  If you are at a normal weight and have a low risk for diabetes, have this test once every three years after 23 years of age.  If you are overweight and have a high risk for diabetes, consider being tested at a younger age or more often. PREVENTING INFECTION  Hepatitis B  If you have a higher risk for  hepatitis B, you should be screened for this virus. You are considered at high risk for hepatitis B if:  You were born in a country where hepatitis B is common. Ask your health care provider which countries are considered high risk.  Your parents were born in a high-risk country, and you have not been immunized against hepatitis B (hepatitis B vaccine).  You have HIV or AIDS.  You use needles to inject street drugs.  You live with someone who has hepatitis B.  You have had sex with someone who has hepatitis B.  You get hemodialysis treatment.  You take certain medicines for conditions, including cancer, organ transplantation, and autoimmune conditions. Hepatitis C  Blood testing is recommended for:  Everyone born from [birth year] through 1964.  Anyone with known risk factors for hepatitis C. Sexually transmitted infections (STIs)  You should be screened for sexually transmitted infections (STIs) including gonorrhea and chlamydia if:  You are sexually active and are younger than 23 years of age.  You are older than 23 years of age and your health care provider tells you that you are at risk for this type of infection.  Your sexual activity has changed since you were last screened and you are at an increased risk for chlamydia or gonorrhea. Ask your health care provider if you are at risk.  If you do not have HIV, but are at risk, it may be recommended that you take a prescription medicine daily to prevent HIV infection. This is called pre-exposure prophylaxis (PrEP). You are considered at risk if:  You are sexually active and do not regularly use condoms or know the HIV status of your partner(s).  You take drugs by injection.  You are sexually active with a partner who has HIV. Talk with your health care provider about whether you are at high risk of being infected with HIV. If you choose to begin PrEP, you should first be tested for HIV. You should then be tested every 3 months for  as long as you are taking PrEP.  PREGNANCY   If you are premenopausal and you may become pregnant, ask your health care provider about preconception counseling.  If you may become pregnant, take 400 to 800 micrograms (mcg) of folic acid every day.  If you want to prevent pregnancy, talk to your  health care provider about birth control (contraception). OSTEOPOROSIS AND MENOPAUSE   Osteoporosis is a disease in which the bones lose minerals and strength with aging. This can result in serious bone fractures. Your risk for osteoporosis can be identified using a bone density scan.  If you are 65 years of age or older, or if you are at risk for osteoporosis and fractures, ask your health care provider if you should be screened.  Ask your health care provider whether you should take a calcium or vitamin D supplement to lower your risk for osteoporosis.  Menopause may have certain physical symptoms and risks.  Hormone replacement therapy may reduce some of these symptoms and risks. Talk to your health care provider about whether hormone replacement therapy is right for you.  HOME CARE INSTRUCTIONS   Schedule regular health, dental, and eye exams.  Stay current with your immunizations.   Do not use any tobacco products including cigarettes, chewing tobacco, or electronic cigarettes.  If you are pregnant, do not drink alcohol.  If you are breastfeeding, limit how much and how often you drink alcohol.  Limit alcohol intake to no more than 1 drink per day for nonpregnant women. One drink equals 12 ounces of beer, 5 ounces of wine, or 1 ounces of hard liquor.  Do not use street drugs.  Do not share needles.  Ask your health care provider for help if you need support or information about quitting drugs.  Tell your health care provider if you often feel depressed.  Tell your health care provider if you have ever been abused or do not feel safe at home. Document Released: 03/01/2011  Document Revised: 12/31/2013 Document Reviewed: 07/18/2013 Exit[employer] Patient Information 2015 [employer]. This information is not intended to replace advice given to you by your health care provider. Make sure you discuss any questions you have with your health care provider. Levonorgestrel intrauterine device (IUD) What is this medicine? LEVONORGESTREL IUD (LEE voe nor jes trel) is a contraceptive (birth control) device. The device is placed inside the uterus by a healthcare professional. It is used to prevent pregnancy and can also be used to treat heavy bleeding that occurs during your period. Depending on the device, it can be used for 3 to 5 years. This medicine may be used for other purposes; ask your health care provider or pharmacist if you have questions. COMMON BRAND NAME(S): LILETTA, Mirena, Skyla What should I tell my health care provider before I take this medicine? They need to know if you have any of these conditions: -abnormal Pap smear -cancer of the breast, uterus, or cervix -diabetes -endometritis -genital or pelvic infection now or in the past -have more than one sexual partner or your partner has more than one partner -heart disease -history of an ectopic or tubal pregnancy -immune system problems -IUD in place -liver disease or tumor -problems with blood clots or take blood-thinners -use intravenous drugs -uterus of unusual shape -vaginal bleeding that has not been explained -an unusual or allergic reaction to levonorgestrel, other hormones, silicone, or polyethylene, medicines, foods, dyes, or preservatives -pregnant or trying to get pregnant -breast-feeding How should I use this medicine? This device is placed inside the uterus by a health care professional. Talk to your pediatrician regarding the use of this medicine in children. Special care may be needed. Overdosage: If you think you have taken too much of this medicine contact a poison control center or  emergency room at once. NOTE: This medicine is   only for you. Do not share this medicine with others. What if I miss a dose? This does not apply. What may interact with this medicine? Do not take this medicine with any of the following medications: -amprenavir -bosentan -fosamprenavir This medicine may also interact with the following medications: -aprepitant -barbiturate medicines for inducing sleep or treating seizures -bexarotene -griseofulvin -medicines to treat seizures like carbamazepine, ethotoin, felbamate, oxcarbazepine, phenytoin, topiramate -modafinil -pioglitazone -rifabutin -rifampin -rifapentine -some medicines to treat HIV infection like atazanavir, indinavir, lopinavir, nelfinavir, tipranavir, ritonavir -St. John's wort -warfarin This list may not describe all possible interactions. Give your health care provider a list of all the medicines, herbs, non-prescription drugs, or dietary supplements you use. Also tell them if you smoke, drink alcohol, or use illegal drugs. Some items may interact with your medicine. What should I watch for while using this medicine? Visit your doctor or health care professional for regular check ups. See your doctor if you or your partner has sexual contact with others, becomes HIV positive, or gets a sexual transmitted disease. This product does not protect you against HIV infection (AIDS) or other sexually transmitted diseases. You can check the placement of the IUD yourself by reaching up to the top of your vagina with clean fingers to feel the threads. Do not pull on the threads. It is a good habit to check placement after each menstrual period. Call your doctor right away if you feel more of the IUD than just the threads or if you cannot feel the threads at all. The IUD may come out by itself. You may become pregnant if the device comes out. If you notice that the IUD has come out use a backup birth control method like condoms and call your  health care provider. Using tampons will not change the position of the IUD and are okay to use during your period. What side effects may I notice from receiving this medicine? Side effects that you should report to your doctor or health care professional as soon as possible: -allergic reactions like skin rash, itching or hives, swelling of the face, lips, or tongue -fever, flu-like symptoms -genital sores -high blood pressure -no menstrual period for 6 weeks during use -pain, swelling, warmth in the leg -pelvic pain or tenderness -severe or sudden headache -signs of pregnancy -stomach cramping -sudden shortness of breath -trouble with balance, talking, or walking -unusual vaginal bleeding, discharge -yellowing of the eyes or skin Side effects that usually do not require medical attention (report to your doctor or health care professional if they continue or are bothersome): -acne -breast pain -change in sex drive or performance -changes in weight -cramping, dizziness, or faintness while the device is being inserted -headache -irregular menstrual bleeding within first 3 to 6 months of use -nausea This list may not describe all possible side effects. Call your doctor for medical advice about side effects. You may report side effects to FDA at 1-800-FDA-1088. Where should I keep my medicine? This does not apply. NOTE: This sheet is a summary. It may not cover all possible information. If you have questions about this medicine, talk to your doctor, pharmacist, or health care provider.  2015, [employer]. (2011-09-16 13:54:04)

## 2014-10-23 NOTE — Progress Notes (Signed)
Jocelyn Harrison Jun 23, 1991 [id number]    History:    Presents for annual exam.  Amenorrheic Depo-Provera, last injection July 2015, condoms since September when Depo-Provera was due. Gardasil series completed. Normal Pap 2015. Negative STD screen with current partner.  Past medical history, past surgical history, family history and social history were all reviewed and documented in the EPIC chart. Working at a [employer] store, planning to return to [employer]. Father diabetes.  ROS:  A ROS was performed and pertinent positives and negatives are included.  Exam:  Filed Vitals:   10/23/14 1003  BP: 132/80    General appearance:  Normal Thyroid:  Symmetrical, normal in size, without palpable masses or nodularity. Respiratory  Auscultation:  Clear without wheezing or rhonchi Cardiovascular  Auscultation:  Regular rate, without rubs, murmurs or gallops  Edema/varicosities:  Not grossly evident Abdominal  Soft,nontender, without masses, guarding or rebound.  Liver/spleen:  No organomegaly noted  Hernia:  None appreciated  Skin  Inspection:  Grossly normal   Breasts: Examined lying and sitting.     Right: Without masses, retractions, discharge or axillary adenopathy.     Left: Without masses, retractions, discharge or axillary adenopathy. Gentitourinary   Inguinal/mons:  Normal without inguinal adenopathy  External genitalia:  Normal  BUS/Urethra/Skene's glands:  Normal  Vagina:  Normal  Cervix:  Normal  Uterus:   normal in size, shape and contour.  Midline and mobile  Adnexa/parametria:     Rt: Without masses or tenderness.   Lt: Without masses or tenderness.  Anus and perineum: Normal    Assessment/Plan:  24 y.o. S WF G0 for annual exam with no complaints.  Amenorrhea Contraception management  Plan: Contraception options reviewed, would like to try Mirena IUD, infection, perforation, hemorrhage reviewed, Dr. Lily PeerFernandez to place will check insurance coverage and return to office.  Abstinence until placement. SBE's, regular exercise, calcium rich diet, MVI daily encouraged. CBC, glucose, qualitative hCG, UA, Pap normal 2015, new screening guidelines reviewed.  Harrington ChallengerYOUNG,Jocelyn Harrison, 11:18 AM 10/23/2014

## 2014-10-24 LAB — URINALYSIS W MICROSCOPIC + REFLEX CULTURE
Bilirubin Urine: NEGATIVE
CASTS: NONE SEEN
CRYSTALS: NONE SEEN
Glucose, UA: NEGATIVE mg/dL
KETONES UR: NEGATIVE mg/dL
Leukocytes, UA: NEGATIVE
Nitrite: POSITIVE — AB
PH: 6.5 (ref 5.0–8.0)
Protein, ur: NEGATIVE mg/dL
SPECIFIC GRAVITY, URINE: 1.024 (ref 1.005–1.030)
UROBILINOGEN UA: 0.2 mg/dL (ref 0.0–1.0)

## 2014-10-24 LAB — HCG, SERUM, QUALITATIVE: Preg, Serum: NEGATIVE

## 2014-10-25 ENCOUNTER — Other Ambulatory Visit: Payer: Self-pay | Admitting: Women's Health

## 2014-10-25 MED ORDER — SULFAMETHOXAZOLE-TRIMETHOPRIM 800-160 MG PO TABS: 1.0000 | ORAL_TABLET | Freq: Two times a day (BID) | ORAL | Status: DC

## 2014-10-26 LAB — URINE CULTURE

## 2014-11-04 ENCOUNTER — Telehealth: Payer: Self-pay | Admitting: Gynecology

## 2014-11-04 ENCOUNTER — Other Ambulatory Visit: Payer: Self-pay | Admitting: Gynecology

## 2014-11-04 NOTE — Telephone Encounter (Signed)
11/04/14-Pt was informed that the Nexplanon for contraception is covered at 100% the same benefits as the Mirena, no copay or deductible/wl

## 2014-11-06 ENCOUNTER — Ambulatory Visit (INDEPENDENT_AMBULATORY_CARE_PROVIDER_SITE_OTHER): Payer: 59 | Admitting: Gynecology

## 2014-11-06 ENCOUNTER — Encounter: Payer: Self-pay | Admitting: Gynecology

## 2014-11-06 VITALS — BP 120/82

## 2014-11-06 DIAGNOSIS — Z975 Presence of (intrauterine) contraceptive device: Secondary | ICD-10-CM | POA: Insufficient documentation

## 2014-11-06 DIAGNOSIS — Z30017 Encounter for initial prescription of implantable subdermal contraceptive: Secondary | ICD-10-CM

## 2014-11-06 DIAGNOSIS — Z308 Encounter for other contraceptive management: Secondary | ICD-10-CM

## 2014-11-06 DIAGNOSIS — N912 Amenorrhea, unspecified: Secondary | ICD-10-CM

## 2014-11-06 LAB — PREGNANCY, URINE: Preg Test, Ur: NEGATIVE

## 2014-11-06 NOTE — Progress Notes (Signed)
   Patient is a 24 year old who presented to the office today to have the Nexplanon put in. Patient been using Depo-Provera for contraception. Her last injection was in September 2015 and she has not had a menstrual cycle. She has been using condoms for contraception. We did do a urine patency test today which was negative. She had previous been provided with literature information on the Nexplanon. Patient's fully where this form of contraception is good for 3 years and that she should use for back up contraception such as with a condom for the first month.                                              Nexplanon Procedure Note (insertion)     The patient was laying on her back with her nondominant left arm flexed at the elbow and externally rotated. The insertion site was identified as the underside of the nondominant upper arm approximately 8 cm from the medial epicondyle of the humerus. 2 marks were made with a sterile marker: The first marked the spot where the Nexplanon  implant was to be inserted, and a second, marked a spot a few centimeters proximal to the first marke to guide the direction of the insertion. The area was cleansed with Betadine solution. The area was anesthetized with 1% lidocaine  (1 cc)  at the area the injection site and underneath the skin along the planned insertion tunnel. The preloaded disposable Nexplanon was removed from its sterile casing.  The applicator was held above the needle at the textured surface area. The transparent protector was removed. With a freehand, the skin was stretched around the insertion site with a thumb and index finger. The skin was then punctured with the tip of the needle angled at 30. The Nexplanon applicator was lowered to a horizontal position. While lifting the skin with the tip of the needle the needle was then slid to its full length. The applicator was kept in sitting position with a needle inserted to its full length. The purple slider was  unlocked by pushing it slightly downward. The slider was fully moved back until it stopped. This allowed the implant to be in the final subdermal position and the needle was locked inside the body of the applicator. The applicator was then removed. A Steri-Strip was made over the incision and a Kerlix wrap was placed which patient is to remove tomorrow. No complications patient tolerated procedure well and was released home with instructions.   [facility] Baptist HospitalFERNANDEZ,JUAN [id number] PMTD@

## 2014-11-07 ENCOUNTER — Encounter: Payer: Self-pay | Admitting: Gynecology

## 2015-01-21 ENCOUNTER — Telehealth: Payer: Self-pay | Admitting: *Deleted

## 2015-01-21 NOTE — Telephone Encounter (Signed)
Pt left message with questions about nexplanon. I left message for pt to call.

## 2015-02-10 ENCOUNTER — Telehealth: Payer: Self-pay | Admitting: Gynecology

## 2015-02-10 NOTE — Telephone Encounter (Signed)
02/10/15-I spoke w/pt today. We inserted Nexplanon with her in March 2016 and she wants it removed and a Skyla IUD inserted. Her ins will cover the [facility] and insertion for contraception at 100% even though we just did the Nexplanon. I did advise her to check with JF to see if the hormones in the [facility] will bother her like the Nexplanon is doing which is why she is having it removed. The Christean Grief has to be done at a separated appt to be paid by ins/wl

## 2015-02-26 ENCOUNTER — Ambulatory Visit: Payer: 59 | Admitting: Gynecology

## 2017-01-12 ENCOUNTER — Encounter: Payer: Self-pay | Admitting: Gynecology

## 2017-01-26 ENCOUNTER — Other Ambulatory Visit: Payer: Self-pay | Admitting: Obstetrics

## 2017-01-28 LAB — CYTOLOGY - PAP

## 2019-09-06 ENCOUNTER — Other Ambulatory Visit: Payer: Self-pay

## 2019-09-28 ENCOUNTER — Other Ambulatory Visit: Payer: Self-pay

## 2019-09-28 ENCOUNTER — Encounter (HOSPITAL_COMMUNITY): Payer: Self-pay | Admitting: Emergency Medicine

## 2019-09-28 ENCOUNTER — Ambulatory Visit (HOSPITAL_COMMUNITY)
Admission: EM | Admit: 2019-09-28 | Discharge: 2019-09-28 | Disposition: A | Discharge status: Home / Self Care | Payer: 59

## 2019-09-28 DIAGNOSIS — R519 Headache, unspecified: Secondary | ICD-10-CM | POA: Diagnosis not present

## 2019-09-28 DIAGNOSIS — W19XXXA Unspecified fall, initial encounter: Secondary | ICD-10-CM

## 2019-09-28 DIAGNOSIS — S0003XA Contusion of scalp, initial encounter: Secondary | ICD-10-CM

## 2019-09-28 DIAGNOSIS — S0990XA Unspecified injury of head, initial encounter: Secondary | ICD-10-CM

## 2019-09-28 MED ORDER — ACETAMINOPHEN 325 MG PO TABS
ORAL_TABLET | ORAL | Status: AC
  Filled 2019-09-28: qty 2

## 2019-09-28 MED ORDER — ACETAMINOPHEN 325 MG PO TABS
650.0000 mg | ORAL_TABLET | Freq: Once | ORAL | Status: AC
  Administered 2019-09-28: 650 mg via ORAL

## 2019-09-28 NOTE — ED Provider Notes (Signed)
[facility]   MRN: [id number] DOB: 11-Apr-1991  Subjective:   Jocelyn Harrison is a 29 y.o. 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  Patient states that she slipped on ice and fell backward making impact with the back of her head.  She states that her vision went black for split-second which she thinks was due to her closing her eyes.  Denies loss of consciousness, vision change, weakness, numbness or tingling, nausea, vomiting, neck pain, dizziness.  Patient has not taken any pain medications.  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  No current facility-administered medications for this encounter.  Current Outpatient Medications:  .  fexofenadine (ALLEGRA) 60 MG tablet, Take 60 mg by mouth 2 (two) times daily., Disp: , Rfl:    No Known Allergies  History reviewed. No pertinent past medical history.   Past Surgical History:  Procedure Laterality Date  . FRACTURE SURGERY      Family History  Problem Relation Age of Onset  . Healthy Mother   . Hypertension Father     Social History   Tobacco Use  . Smoking status: Never Smoker  . Smokeless tobacco: Never Used  Substance Use Topics  . Alcohol use: Yes  . Drug use: Never    ROS   Objective:   Vitals: BP (!) 148/108 (BP Location: Right Arm)   Pulse 85   Temp 98.6 F (37 C) (Oral)   Resp 18   LMP 09/04/2019 (Approximate)   SpO2 100%   Physical Exam Constitutional:      General: She is not in acute distress.    Appearance: Normal appearance. She is well-developed. She is not ill-appearing, toxic-appearing or diaphoretic.  HENT:     Head: Normocephalic and atraumatic.      Right Ear: Tympanic membrane and ear canal normal. No drainage or tenderness. No middle ear effusion. Tympanic membrane is not erythematous.     Left Ear: Tympanic membrane and ear canal  normal. No drainage or tenderness.  No middle ear effusion. Tympanic membrane is not erythematous.     Nose: Nose normal. No congestion or rhinorrhea.     Mouth/Throat:     Mouth: Mucous membranes are moist. No oral lesions.     Pharynx: Oropharynx is clear. No pharyngeal swelling, oropharyngeal exudate, posterior oropharyngeal erythema or uvula swelling.     Tonsils: No tonsillar exudate or tonsillar abscesses.  Eyes:     General: No scleral icterus.    Extraocular Movements: Extraocular movements intact.     Right eye: Normal extraocular motion.     Left eye: Normal extraocular motion.     Conjunctiva/sclera: Conjunctivae normal.     Pupils: Pupils are equal, round, and reactive to light.  Cardiovascular:     Rate and Rhythm: Normal rate.  Pulmonary:     Effort: Pulmonary effort is normal.  Musculoskeletal:        General: No swelling or tenderness. Normal range of motion.     Cervical back: Normal range of motion and neck supple. No rigidity or tenderness.  Lymphadenopathy:     Cervical: No cervical adenopathy.  Skin:    General: Skin is warm and dry.  Neurological:     General: No focal deficit present.     Mental Status: She is alert and oriented to person, place, and time.  Cranial Nerves: No cranial nerve deficit.     Sensory: No sensory deficit.     Motor: No weakness.     Coordination: Coordination normal.     Gait: Gait normal.     Deep Tendon Reflexes: Reflexes normal.     Comments: Finger-to-nose, heel-to-shin, rapid alternating hand movements intact.  Psychiatric:        Mood and Affect: Mood normal.        Behavior: Behavior normal.        Thought Content: Thought content normal.        Judgment: Judgment normal.        Assessment and Plan :   1. Acute nonintractable headache, unspecified headache type   2. Fall, initial encounter   3. Injury of head, initial encounter   4. Contusion of scalp, initial encounter     Counseled patient on signs of  concussion.  For now given that her employer denied Worker's Compensation, will provide her with work restrictions that extend until October 05, 2019.  Patient states that she will try to follow-up with her PCP for further evaluation clearance to return to work.  Patient was given Tylenol while in the clinic, recommended that she schedule this as well.  At this time I do not suspect that patient is in need of a head CT despite her head injury.  She has reassuring neurologic physical exam findings.  Counseled patient on potential for adverse effects with medications prescribed/recommended today, ER and return-to-clinic precautions discussed, patient verbalized understanding.    Jaynee Eagles, PA-C 09/28/19 1434

## 2019-09-28 NOTE — Discharge Instructions (Signed)
You may take 500mg -650mg  Tylenol every 6 hours for aches and pains.

## 2019-09-28 NOTE — ED Triage Notes (Addendum)
Pt reports slipping on ice and falling, hitting the back of her head.  Pt reports no LOC, N, V , or changes in vision.  Pt does report having a severe headache. 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

## 2019-10-09 ENCOUNTER — Encounter: Payer: Self-pay | Admitting: Gynecology

## 2020-06-19 ENCOUNTER — Other Ambulatory Visit (HOSPITAL_COMMUNITY): Payer: Self-pay | Admitting: Physician Assistant

## 2020-06-19 ENCOUNTER — Other Ambulatory Visit: Payer: Self-pay | Admitting: Obstetrics

## 2020-06-19 MED ORDER — DIPHENHYDRAMINE HCL 50 MG/ML IJ SOLN: 50.0000 mg | Freq: Once | INTRAMUSCULAR | Status: AC | PRN

## 2020-06-19 MED ORDER — METHYLPREDNISOLONE SODIUM SUCC 125 MG IJ SOLR: 125.0000 mg | Freq: Once | INTRAMUSCULAR | Status: AC | PRN

## 2020-06-19 MED ORDER — ALBUTEROL SULFATE HFA 108 (90 BASE) MCG/ACT IN AERS: 2.0000 | INHALATION_SPRAY | Freq: Once | RESPIRATORY_TRACT | Status: AC | PRN

## 2020-06-19 MED ORDER — EPINEPHRINE PF 1 MG/ML IJ SOLN: 0.3000 mg | Freq: Once | INTRAMUSCULAR | Status: AC | PRN

## 2020-06-19 MED ORDER — CASIRIVIMAB-IMDEVIMAB 600-600 MG/10ML IJ SOLN: 300.0000 mg | INTRAMUSCULAR | Status: AC

## 2020-06-19 NOTE — Progress Notes (Signed)
I connected by phone with Jocelyn Harrison on 06/19/2020 at 4:58 PM to discuss the potential use of a new treatment for mild to moderate COVID-19 viral infection in non-hospitalized patients.  This patient is a 30 y.o. female that meets the FDA criteria for Emergency Use Authorization of COVID monoclonal antibody casirivimab/imdevimab or bamlanivimab/eteseviamb.  Has a (+) direct SARS-CoV-2 viral test result  Has mild or moderate COVID-19   Is NOT hospitalized due to COVID-19  Is within 10 days of symptom onset  Has at least one of the high risk factor(s) for progression to severe COVID-19 and/or hospitalization as defined in EUA.  Specific high risk criteria : BMI > 25 and Pregnancy    I have spoken and communicated the following to the patient or parent/caregiver regarding COVID monoclonal antibody treatment:  1. FDA has authorized the emergency use for the treatment of mild to moderate COVID-19 in adults and pediatric patients with positive results of direct SARS-CoV-2 viral testing who are 58 years of age and older weighing at least 40 kg, and who are at high risk for progressing to severe COVID-19 and/or hospitalization.  2. The significant known and potential risks and benefits of COVID monoclonal antibody, and the extent to which such potential risks and benefits are unknown.  3. Information on available alternative treatments and the risks and benefits of those alternatives, including clinical trials.  4. Patients treated with COVID monoclonal antibody should continue to self-isolate and use infection control measures (e.g., wear mask, isolate, social distance, avoid sharing personal items, clean and disinfect "high touch" surfaces, and frequent handwashing) according to CDC guidelines.   5. The patient or parent/caregiver has the option to accept or refuse COVID monoclonal antibody treatment.  After reviewing this information with the patient, the patient has agreed to receive one of  the available covid 19 monoclonal antibodies and will be provided an appropriate fact sheet prior to infusion. Jodell Cipro, PA-C 06/19/2020 4:58 PM

## 2020-06-20 ENCOUNTER — Other Ambulatory Visit (HOSPITAL_COMMUNITY): Payer: Self-pay

## 2020-06-20 ENCOUNTER — Ambulatory Visit (HOSPITAL_COMMUNITY)
Admission: RE | Admit: 2020-06-20 | Discharge: 2020-06-20 | Disposition: A | Discharge status: Home / Self Care | Payer: Federal, State, Local not specified - PPO | Source: Ambulatory Visit | Attending: Pulmonary Disease | Admitting: Pulmonary Disease

## 2020-06-20 DIAGNOSIS — Z3A00 Weeks of gestation of pregnancy not specified: Secondary | ICD-10-CM | POA: Insufficient documentation

## 2020-06-20 DIAGNOSIS — U071 COVID-19: Secondary | ICD-10-CM | POA: Insufficient documentation

## 2020-06-20 DIAGNOSIS — O98519 Other viral diseases complicating pregnancy, unspecified trimester: Secondary | ICD-10-CM | POA: Insufficient documentation

## 2020-06-20 MED ORDER — DIPHENHYDRAMINE HCL 50 MG/ML IJ SOLN: 50.0000 mg | Freq: Once | INTRAMUSCULAR | Status: DC | PRN

## 2020-06-20 MED ORDER — ALBUTEROL SULFATE HFA 108 (90 BASE) MCG/ACT IN AERS: 2.0000 | INHALATION_SPRAY | Freq: Once | RESPIRATORY_TRACT | Status: DC | PRN

## 2020-06-20 MED ORDER — SODIUM CHLORIDE 0.9 % IV SOLN: INTRAVENOUS | Status: DC | PRN

## 2020-06-20 MED ORDER — EPINEPHRINE 0.3 MG/0.3ML IJ SOAJ: 0.3000 mg | Freq: Once | INTRAMUSCULAR | Status: DC | PRN

## 2020-06-20 MED ORDER — FAMOTIDINE IN NACL 20-0.9 MG/50ML-% IV SOLN: 20.0000 mg | Freq: Once | INTRAVENOUS | Status: DC | PRN

## 2020-06-20 MED ORDER — METHYLPREDNISOLONE SODIUM SUCC 125 MG IJ SOLR: 125.0000 mg | Freq: Once | INTRAMUSCULAR | Status: DC | PRN

## 2020-06-20 MED ORDER — SODIUM CHLORIDE 0.9 % IV SOLN: Freq: Once | INTRAVENOUS | Status: AC

## 2020-06-20 NOTE — Discharge Instructions (Signed)

## 2020-06-20 NOTE — Progress Notes (Signed)
  Diagnosis: COVID-19  Physician: Dr. Shan Levans  Procedure: [facility] Infusion Clinic Med: bamlanivimab\etesevimab infusion - Provided patient with bamlanimivab\etesevimab fact sheet for patients, parents and caregivers prior to infusion.  Complications: No immediate complications noted.  Discharge: Discharged home   Essie Hart 06/20/2020

## 2020-07-29 ENCOUNTER — Other Ambulatory Visit: Payer: Self-pay | Admitting: Obstetrics

## 2020-07-29 DIAGNOSIS — Z3A22 22 weeks gestation of pregnancy: Secondary | ICD-10-CM

## 2020-07-29 DIAGNOSIS — Z363 Encounter for antenatal screening for malformations: Secondary | ICD-10-CM

## 2020-08-04 ENCOUNTER — Encounter: Payer: Self-pay | Admitting: *Deleted

## 2020-08-06 ENCOUNTER — Ambulatory Visit: Payer: Federal, State, Local not specified - PPO | Attending: Obstetrics

## 2020-08-06 ENCOUNTER — Other Ambulatory Visit: Payer: Self-pay

## 2020-08-06 ENCOUNTER — Other Ambulatory Visit: Payer: Self-pay | Admitting: *Deleted

## 2020-08-06 DIAGNOSIS — Z3A22 22 weeks gestation of pregnancy: Secondary | ICD-10-CM | POA: Insufficient documentation

## 2020-08-06 DIAGNOSIS — Z363 Encounter for antenatal screening for malformations: Secondary | ICD-10-CM | POA: Insufficient documentation

## 2020-08-06 DIAGNOSIS — Z362 Encounter for other antenatal screening follow-up: Secondary | ICD-10-CM

## 2020-09-03 ENCOUNTER — Other Ambulatory Visit: Payer: Self-pay

## 2020-09-03 ENCOUNTER — Ambulatory Visit: Payer: Federal, State, Local not specified - PPO | Admitting: *Deleted

## 2020-09-03 ENCOUNTER — Ambulatory Visit: Payer: Federal, State, Local not specified - PPO | Attending: Obstetrics and Gynecology

## 2020-09-03 VITALS — BP 125/68 | HR 91

## 2020-09-03 DIAGNOSIS — Z362 Encounter for other antenatal screening follow-up: Secondary | ICD-10-CM | POA: Insufficient documentation

## 2020-09-03 DIAGNOSIS — O4402 Placenta previa specified as without hemorrhage, second trimester: Secondary | ICD-10-CM

## 2020-09-03 DIAGNOSIS — Z3A26 26 weeks gestation of pregnancy: Secondary | ICD-10-CM | POA: Diagnosis not present

## 2020-09-03 DIAGNOSIS — O4442 Low lying placenta NOS or without hemorrhage, second trimester: Secondary | ICD-10-CM | POA: Diagnosis not present

## 2020-09-04 ENCOUNTER — Other Ambulatory Visit: Payer: Self-pay | Admitting: *Deleted

## 2020-09-04 DIAGNOSIS — O4440 Low lying placenta NOS or without hemorrhage, unspecified trimester: Secondary | ICD-10-CM

## 2020-10-01 ENCOUNTER — Ambulatory Visit: Payer: Federal, State, Local not specified - PPO | Attending: Maternal & Fetal Medicine

## 2020-10-01 ENCOUNTER — Encounter: Payer: Self-pay | Admitting: *Deleted

## 2020-10-01 ENCOUNTER — Other Ambulatory Visit: Payer: Self-pay

## 2020-10-01 ENCOUNTER — Ambulatory Visit: Payer: Federal, State, Local not specified - PPO | Admitting: *Deleted

## 2020-10-01 VITALS — BP 125/72 | HR 89

## 2020-10-01 DIAGNOSIS — Z3A30 30 weeks gestation of pregnancy: Secondary | ICD-10-CM | POA: Diagnosis not present

## 2020-10-01 DIAGNOSIS — O4443 Low lying placenta NOS or without hemorrhage, third trimester: Secondary | ICD-10-CM | POA: Diagnosis not present

## 2020-10-01 DIAGNOSIS — O4440 Low lying placenta NOS or without hemorrhage, unspecified trimester: Secondary | ICD-10-CM | POA: Diagnosis not present

## 2020-10-01 DIAGNOSIS — Z3689 Encounter for other specified antenatal screening: Secondary | ICD-10-CM

## 2020-10-01 IMAGING — US US MFM OB FOLLOW-UP
1 series · 14 of 28 positions shown · non-contrast
Comparison: none

[Series 1: us mfm ob follow-up · 37 acquisitions, 14 frames shown]
[im 2/37]
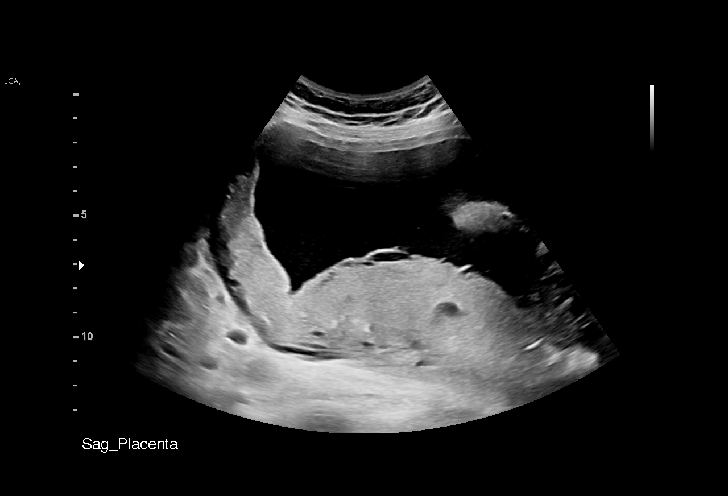
[im 5/37]
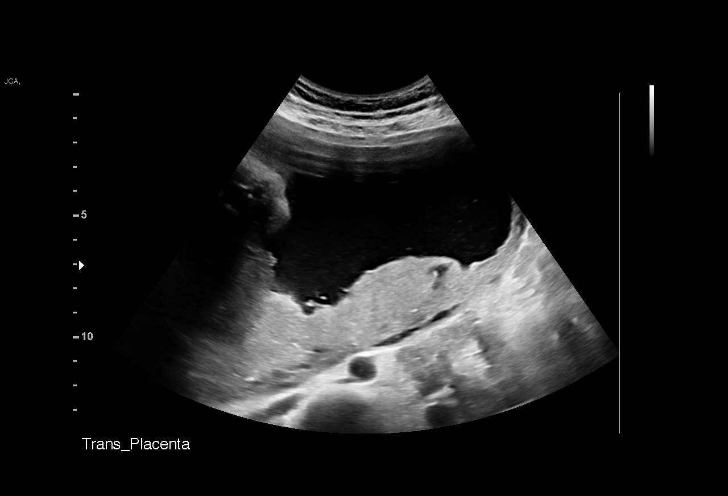
[im 7/37]
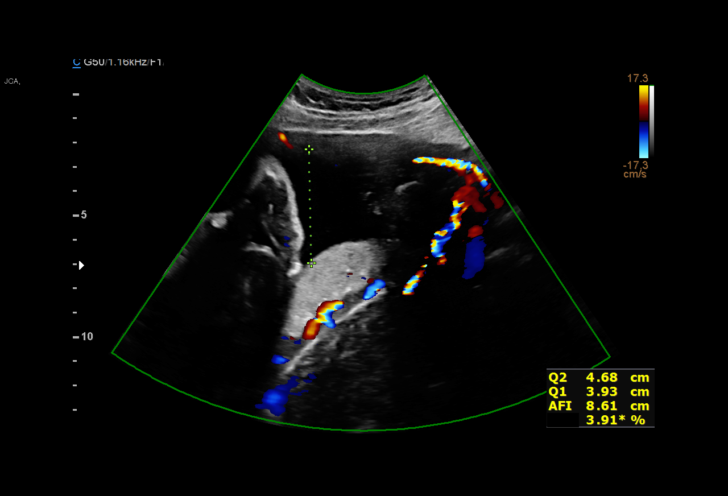
[im 10/37]
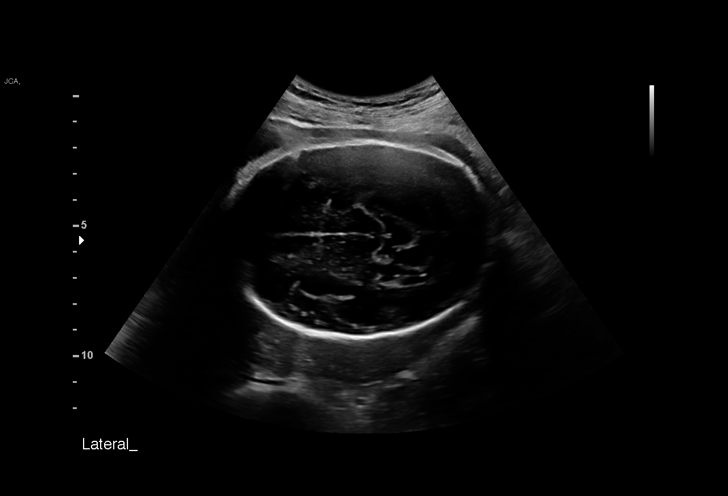
[im 13/37]
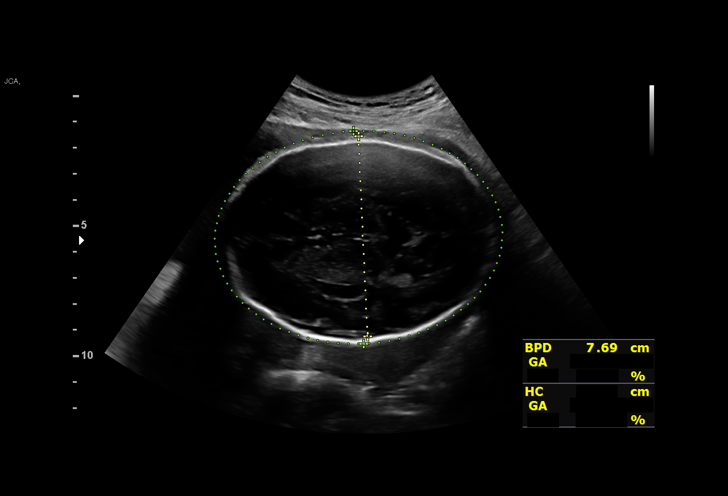
[im 15/37]
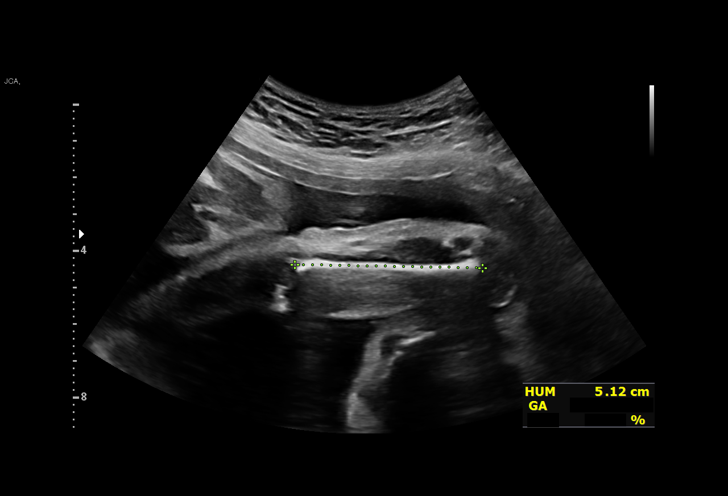
[im 18/37]
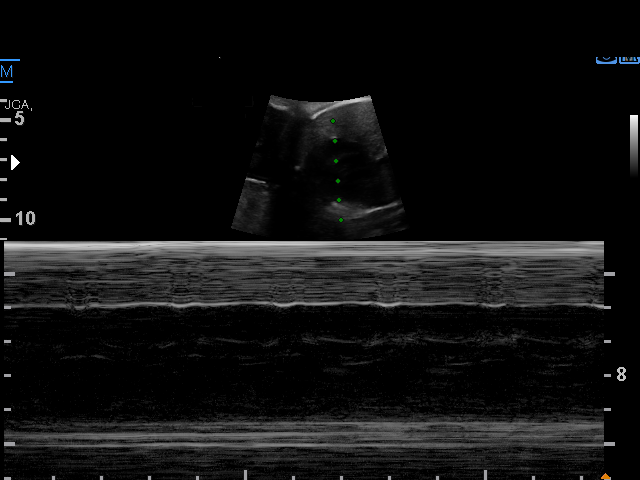
[im 21/37]
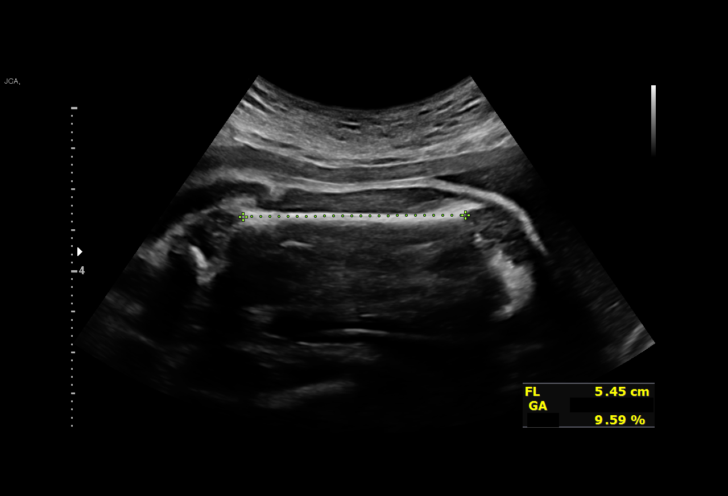
[im 23/37]
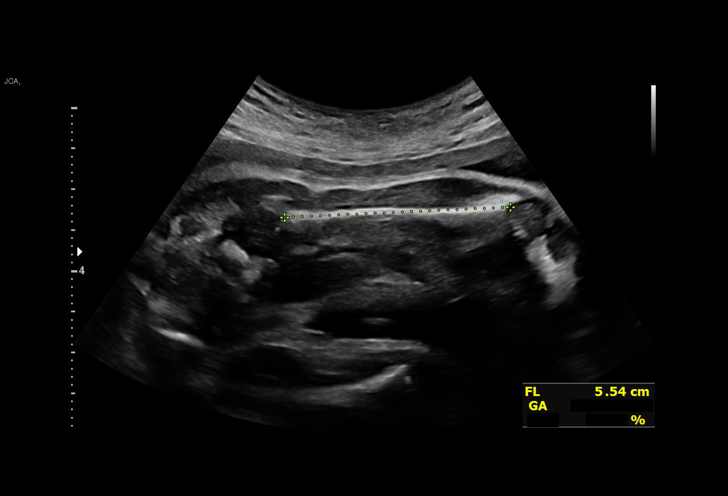
[im 26/37]
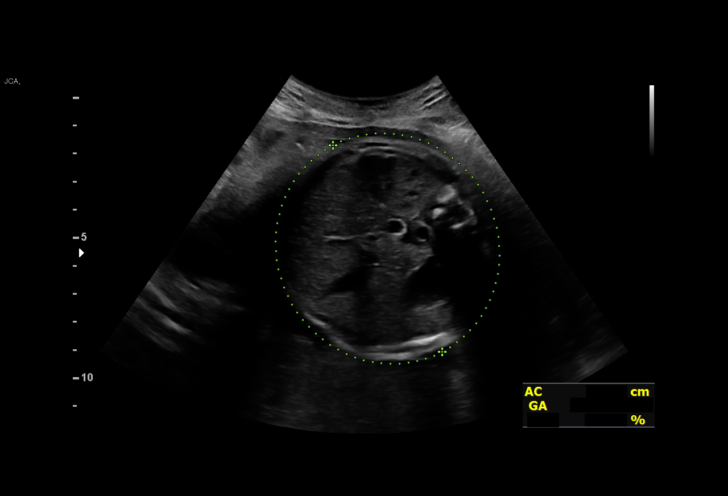
[im 29/37]
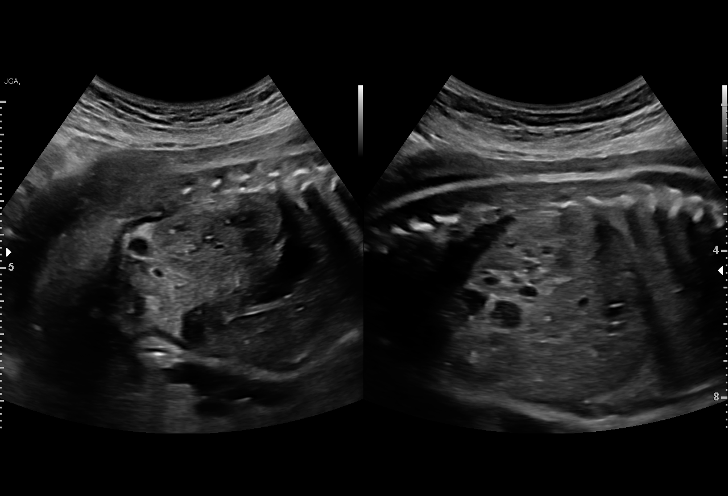
[im 31/37]
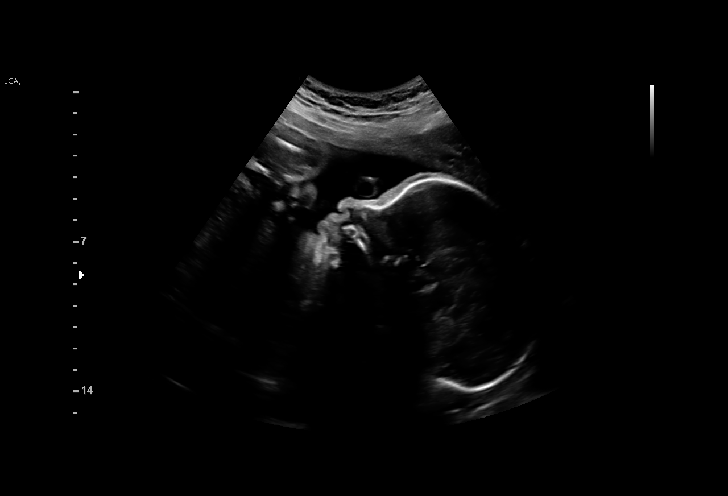
[im 34/37]
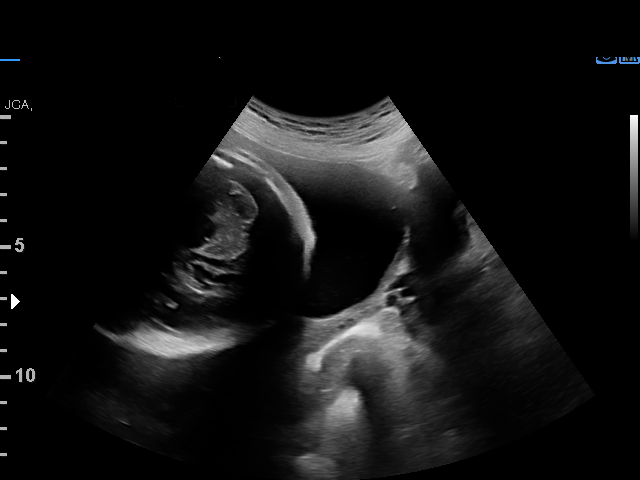
[im 37/37]
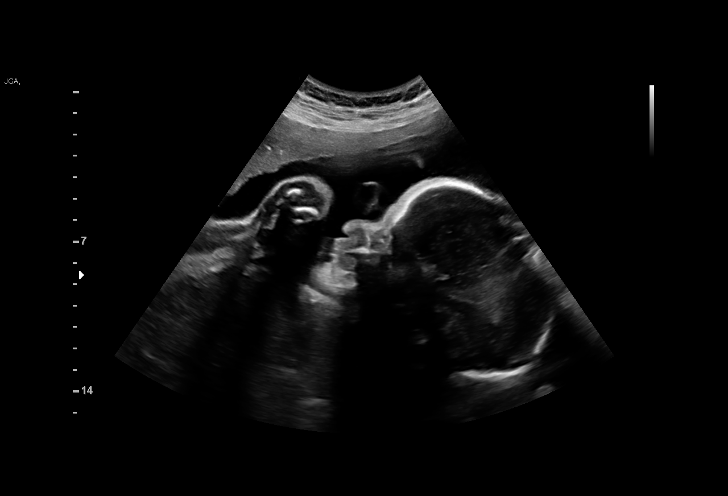

[14 of 28 positions shown; findings below may reference images not displayed]

OBGYN

                                                      DE LEON

Indications

 Low lying placenta, antepartum                 [35]
 Encounter for antenatal screening,             [35]
 unspecified
 30 weeks gestation of pregnancy
Fetal Evaluation

 Num Of Fetuses:         1
 Fetal Heart Rate(bpm):  135
 Cardiac Activity:       Observed
 Presentation:           Cephalic

 Amniotic Fluid
 AFI FV:      Within normal limits

 AFI Sum(cm)     %Tile       Largest Pocket(cm)
 16.22           59

 RUQ(cm)       RLQ(cm)       LUQ(cm)        LLQ(cm)
 3.93          3.61          4.68           4
Biometry
 BPD:      77.2  mm     G. Age:  31w 0d         69  %    CI:        67.56   %    70 - 86
                                                         FL/HC:      18.5   %    19.2 -
 HC:      300.6  mm     G. Age:  33w 2d         95  %    HC/AC:      1.21        0.99 -
 AC:      248.8  mm     G. Age:  29w 1d         20  %    FL/BPD:     72.0   %    71 - 87
 FL:       55.6  mm     G. Age:  29w 2d         18  %    FL/AC:      22.3   %    20 - 24
 HUM:      49.6  mm     G. Age:  29w 1d         34  %

 LV:        3.1  mm

 Est. FW:    [35]  gm      3 lb 3 oz     27  %
OB History

 Gravidity:    1
Gestational Age

 Clinical EDD:  30w 0d                                        EDD:   [DATE]
 U/S Today:     30w 5d                                        EDD:   [DATE]
 Best:          30w 0d     Det. By:  Clinical EDD             EDD:   [DATE]
Anatomy

 Cranium:               Appears normal         LVOT:                   Previously seen
 Cavum:                 Appears normal         Aortic Arch:            Previously seen
 Ventricles:            Appears normal         Ductal Arch:            Previously seen
 Choroid Plexus:        Previously seen        Diaphragm:              Appears normal
 Cerebellum:            Previously seen        Stomach:                Appears normal, left
                                                                       sided
 Posterior Fossa:       Previously seen        Abdomen:                Previously seen
 Nuchal Fold:           Not applicable (>20    Abdominal Wall:         Previously seen
                        wks GA)
 Face:                  Orbits and profile     Cord Vessels:           Previously seen
                        previously seen
 Lips:                  Previously seen        Kidneys:                Appear normal
 Palate:                Previously seen        Bladder:                Appears normal
 Thoracic:              Appears normal         Spine:                  Previously seen
 Heart:                 Appears normal         Upper Extremities:      Previously seen
                        (4CH, axis, and
                        situs)
 RVOT:                  Previously seen        Lower Extremities:      Previously seen
Impression

 Patient returns for fetal growth assessment and evaluation of
 placental position.  She does not give history of vaginal
 bleeding.  She has screening for gestational diabetes
 yesterday and the results are pending.

 Fetal growth is appropriate for gestational age .Amniotic fluid
 is normal and good fetal activity is seen .  Placenta is
 posterior and is not low-lying.

 I reassured that the patient can have vaginal delivery.
Recommendations

 Follow-up scans as clinically indicated.
                 DE LEON

## 2020-10-24 ENCOUNTER — Inpatient Hospital Stay (HOSPITAL_COMMUNITY)
Admission: AD | Admit: 2020-10-24 | Discharge: 2020-10-24 | Disposition: A | Discharge status: Home / Self Care | Payer: Federal, State, Local not specified - PPO | Attending: Obstetrics and Gynecology | Admitting: Obstetrics and Gynecology

## 2020-10-24 ENCOUNTER — Encounter (HOSPITAL_COMMUNITY): Payer: Self-pay | Admitting: Obstetrics and Gynecology

## 2020-10-24 ENCOUNTER — Inpatient Hospital Stay (HOSPITAL_BASED_OUTPATIENT_CLINIC_OR_DEPARTMENT_OTHER): Payer: Federal, State, Local not specified - PPO

## 2020-10-24 ENCOUNTER — Other Ambulatory Visit: Payer: Self-pay

## 2020-10-24 DIAGNOSIS — O368190 Decreased fetal movements, unspecified trimester, not applicable or unspecified: Secondary | ICD-10-CM

## 2020-10-24 DIAGNOSIS — O368130 Decreased fetal movements, third trimester, not applicable or unspecified: Secondary | ICD-10-CM | POA: Diagnosis not present

## 2020-10-24 DIAGNOSIS — O4443 Low lying placenta NOS or without hemorrhage, third trimester: Secondary | ICD-10-CM

## 2020-10-24 DIAGNOSIS — Z3A33 33 weeks gestation of pregnancy: Secondary | ICD-10-CM

## 2020-10-24 DIAGNOSIS — Z3689 Encounter for other specified antenatal screening: Secondary | ICD-10-CM | POA: Diagnosis not present

## 2020-10-24 DIAGNOSIS — O4440 Low lying placenta NOS or without hemorrhage, unspecified trimester: Secondary | ICD-10-CM

## 2020-10-24 LAB — URINALYSIS, ROUTINE W REFLEX MICROSCOPIC
Bilirubin Urine: NEGATIVE
Glucose, UA: NEGATIVE mg/dL
Hgb urine dipstick: NEGATIVE
Ketones, ur: NEGATIVE mg/dL
Leukocytes,Ua: NEGATIVE
Nitrite: NEGATIVE
Protein, ur: NEGATIVE mg/dL
Specific Gravity, Urine: 1.011 (ref 1.005–1.030)
pH: 7 (ref 5.0–8.0)

## 2020-10-24 NOTE — Discharge Instructions (Signed)
Fetal Movement Counts Patient Name: ________________________________________________ Patient Due Date: ____________________  What is a fetal movement count? A fetal movement count is the number of times that you feel your baby move during a certain amount of time. This may also be called a fetal kick count. A fetal movement count is recommended for every pregnant woman. You may be asked to start counting fetal movements as early as week 28 of your pregnancy. Pay attention to when your baby is most active. You may notice your baby's sleep and wake cycles. You may also notice things that make your baby move more. You should do a fetal movement count:  When your baby is normally most active.  At the same time each day. A good time to count movements is while you are resting, after having something to eat and drink. How do I count fetal movements? 1. Find a quiet, comfortable area. Sit, or lie down on your side. 2. Write down the date, the start time and stop time, and the number of movements that you felt between those two times. Take this information with you to your health care visits. 3. Write down your start time when you feel the first movement. 4. Count kicks, flutters, swishes, rolls, and jabs. You should feel at least 10 movements. 5. You may stop counting after you have felt 10 movements, or if you have been counting for 2 hours. Write down the stop time. 6. If you do not feel 10 movements in 2 hours, contact your health care provider for further instructions. Your health care provider may want to do additional tests to assess your baby's well-being. Contact a health care provider if:  You feel fewer than 10 movements in 2 hours.  Your baby is not moving like he or she usually does. Date: ____________ Start time: ____________ Stop time: ____________ Movements: ____________ Date: ____________ Start time: ____________ Stop time: ____________ Movements: ____________ Date: ____________  Start time: ____________ Stop time: ____________ Movements: ____________ Date: ____________ Start time: ____________ Stop time: ____________ Movements: ____________ Date: ____________ Start time: ____________ Stop time: ____________ Movements: ____________ Date: ____________ Start time: ____________ Stop time: ____________ Movements: ____________ Date: ____________ Start time: ____________ Stop time: ____________ Movements: ____________ Date: ____________ Start time: ____________ Stop time: ____________ Movements: ____________ Date: ____________ Start time: ____________ Stop time: ____________ Movements: ____________ This information is not intended to replace advice given to you by your health care provider. Make sure you discuss any questions you have with your health care provider. Document Revised: 04/05/2019 Document Reviewed: 04/05/2019 Elsevier Patient Education  2021 [employer].  

## 2020-10-24 NOTE — MAU Note (Signed)
Patient presents with decreased fetal movement noted since yesterday.  States yesterday she had a lot of acid reflux and was laying down most of the day and noticed she hadn't felt baby move as much.  Today she's feeling better but at work continued to notice baby not moving as much as she is used to.  Last fetal movement detected was 1 hour ago.  (FHR 140s).  Denies any pain/LOF/VB.

## 2020-10-24 NOTE — MAU Provider Note (Addendum)
History     CSN: [id number]  Arrival date and time: 10/24/20 1258   Event Date/Time   First Provider Initiated Contact with Patient 10/24/20 1333      Chief Complaint  Patient presents with  . Decreased Fetal Movement   30 y.o. G1 @33 .2 wks presenting with decreased FM. Reports less movement since yesterday. Reports feeling well. No recent illness. She is eating and drinking well. Denies VB, LOF, and ctx. Her pregnancy has been complicated by placenta previa which the pt states has resolved.    OB History    Gravida  1   Para      Term      Preterm      AB      Living        SAB      IAB      Ectopic      Multiple      Live Births              History reviewed. No pertinent past medical history.  Past Surgical History:  Procedure Laterality Date  . FRACTURE SURGERY    . SHOULDER SURGERY      Family History  Problem Relation Age of Onset  . Healthy Mother   . Hypertension Mother   . Hypertension Father   . Heart disease Father   . Diabetes Maternal Grandmother   . Diabetes Maternal Grandfather     Social History   Tobacco Use  . Smoking status: Never Smoker  . Smokeless tobacco: Never Used  Vaping Use  . Vaping Use: Never used  Substance Use Topics  . Alcohol use: Yes    Comment: OCC  . Drug use: Never    Allergies:  Allergies  Allergen Reactions  . Lactose Intolerance (Gi)     Facility-Administered Medications Prior to Admission  Medication Dose Route Frequency Provider Last Rate Last Admin  . levonorgestrel (MIRENA) 20 MCG/24HR IUD   Intrauterine Once , MD       Medications Prior to Admission  Medication Sig Dispense Refill Last Dose  . calcium-vitamin D (OSCAL WITH D) 500-200 MG-UNIT tablet Take 1 tablet by mouth.   10/23/2020 at Unknown time  . Prenatal Vit-Fe Fumarate-FA (PRENATAL MULTIVITAMIN) TABS tablet Take 1 tablet by mouth daily at 12 noon.   10/23/2020 at Unknown time  . fexofenadine (ALLEGRA) 60 MG  tablet Take 60 mg by mouth 2 (two) times daily. (Patient not taking: Reported on 09/03/2020)     . medroxyPROGESTERone (DEPO-PROVERA) 150 MG/ML injection INJECT 1 ML (150 MG TOTAL) INTO THE MUSCLE ONCE. (Patient not taking: No sig reported) 1 mL 3   . polyethylene glycol (MIRALAX / GLYCOLAX) 17 g packet Take 17 g by mouth daily.     11/01/2020 sulfamethoxazole-trimethoprim (BACTRIM DS,SEPTRA DS) 800-160 MG per tablet Take 1 tablet by mouth 2 (two) times daily. (Patient not taking: No sig reported) 6 tablet 0     Review of Systems  Gastrointestinal: Negative for abdominal pain.  Genitourinary: Negative for vaginal bleeding and vaginal discharge.   Physical Exam   Blood pressure 135/83, pulse (!) 103, temperature 98.1 F (36.7 C), resp. rate 17, last menstrual period 03/04/2020, SpO2 97 %.  Physical Exam Nursing note reviewed. Exam conducted with a chaperone present.  Constitutional:      General: She is not in acute distress.    Appearance: Normal appearance.  HENT:     Head: Normocephalic and atraumatic.  Cardiovascular:  Rate and Rhythm: Normal rate.  Pulmonary:     Effort: Pulmonary effort is normal. No respiratory distress.  Musculoskeletal:        General: Normal range of motion.     Cervical back: Normal range of motion.  Neurological:     General: No focal deficit present.     Mental Status: She is alert and oriented to person, place, and time.  Psychiatric:        Mood and Affect: Mood normal.        Behavior: Behavior normal.   EFM: 135 bpm, mod variability, + accels, no decels Toco: none  Results for orders placed or performed during the hospital encounter of 10/24/20 (from the past 24 hour(s))  Urinalysis, Routine w reflex microscopic Urine, Clean Catch     Status: None   Collection Time: 10/24/20  1:28 PM  Result Value Ref Range   Color, Urine YELLOW YELLOW   APPearance CLEAR CLEAR   Specific Gravity, Urine 1.011 1.005 - 1.030   pH 7.0 5.0 - 8.0   Glucose, UA  NEGATIVE NEGATIVE mg/dL   Hgb urine dipstick NEGATIVE NEGATIVE   Bilirubin Urine NEGATIVE NEGATIVE   Ketones, ur NEGATIVE NEGATIVE mg/dL   Protein, ur NEGATIVE NEGATIVE mg/dL   Nitrite NEGATIVE NEGATIVE   Leukocytes,Ua NEGATIVE NEGATIVE    Media Information         MAU Course  Procedures  MDM Review of prenatal records shows pregnancy complicated by IUI with donor sperm d/t spouse with Klinefelters, no mention of previa or LLP. [place]: BPP 8/8 and rNST>10/10, normal AFI, breech presentation, low lying placenta. Feeling more FM now, marked 19 times on NST in 1 hr. Pt reassured. Stable for discharge home.    Assessment and Plan   1. [redacted] weeks gestation of pregnancy   2. Decreased fetal movement   3. NST (non-stress test) reactive   4. Low-lying placenta    Discharge home Follow [facility] at [facility] as scheduled [facility]  Allergies as of 10/24/2020      Reactions   Lactose Intolerance (gi)       Medication List    STOP taking these medications   fexofenadine 60 MG tablet Commonly known as: ALLEGRA   medroxyPROGESTERone 150 MG/ML injection Commonly known as: DEPO-PROVERA   sulfamethoxazole-trimethoprim 800-160 MG tablet Commonly known as: BACTRIM DS     TAKE these medications   calcium-vitamin D 500-200 MG-UNIT tablet Commonly known as: OSCAL WITH D Take 1 tablet by mouth.   polyethylene glycol 17 g packet Commonly known as: MIRALAX / GLYCOLAX Take 17 g by mouth daily.   prenatal multivitamin Tabs tablet Take 1 tablet by mouth daily at 12 noon.       Donette Larry , CNM 10/24/2020, 1:42 PM

## 2020-11-14 LAB — OB RESULTS CONSOLE GBS: GBS: NEGATIVE

## 2020-12-01 ENCOUNTER — Encounter (HOSPITAL_COMMUNITY): Payer: Self-pay | Admitting: Obstetrics and Gynecology

## 2020-12-01 ENCOUNTER — Other Ambulatory Visit: Payer: Self-pay

## 2020-12-01 ENCOUNTER — Inpatient Hospital Stay (EMERGENCY_DEPARTMENT_HOSPITAL)
Admission: AD | Admit: 2020-12-01 | Discharge: 2020-12-01 | Disposition: A | Discharge status: Home / Self Care | Payer: Federal, State, Local not specified - PPO | Source: Home / Self Care | Attending: Obstetrics and Gynecology | Admitting: Obstetrics and Gynecology

## 2020-12-01 DIAGNOSIS — Z3A38 38 weeks gestation of pregnancy: Secondary | ICD-10-CM | POA: Diagnosis not present

## 2020-12-01 DIAGNOSIS — O163 Unspecified maternal hypertension, third trimester: Secondary | ICD-10-CM

## 2020-12-01 DIAGNOSIS — O26893 Other specified pregnancy related conditions, third trimester: Secondary | ICD-10-CM | POA: Insufficient documentation

## 2020-12-01 DIAGNOSIS — O134 Gestational [pregnancy-induced] hypertension without significant proteinuria, complicating childbirth: Secondary | ICD-10-CM | POA: Diagnosis not present

## 2020-12-01 DIAGNOSIS — R030 Elevated blood-pressure reading, without diagnosis of hypertension: Secondary | ICD-10-CM | POA: Insufficient documentation

## 2020-12-01 LAB — COMPREHENSIVE METABOLIC PANEL
ALT: 16 U/L (ref 0–44)
AST: 18 U/L (ref 15–41)
Albumin: 2.9 g/dL — ABNORMAL LOW (ref 3.5–5.0)
Alkaline Phosphatase: 153 U/L — ABNORMAL HIGH (ref 38–126)
Anion gap: 9 (ref 5–15)
BUN: 8 mg/dL (ref 6–20)
CO2: 23 mmol/L (ref 22–32)
Calcium: 9.3 mg/dL (ref 8.9–10.3)
Chloride: 103 mmol/L (ref 98–111)
Creatinine, Ser: 0.44 mg/dL (ref 0.44–1.00)
GFR, Estimated: >60 mL/min (ref >60)
Glucose, Bld: 86 mg/dL (ref 70–99)
Potassium: 4.3 mmol/L (ref 3.5–5.1)
Sodium: 135 mmol/L (ref 135–145)
Total Bilirubin: 0.4 mg/dL (ref 0.3–1.2)
Total Protein: 6.3 g/dL — ABNORMAL LOW (ref 6.5–8.1)

## 2020-12-01 LAB — URINALYSIS, ROUTINE W REFLEX MICROSCOPIC
Bilirubin Urine: NEGATIVE
Glucose, UA: NEGATIVE mg/dL
Hgb urine dipstick: NEGATIVE
Ketones, ur: NEGATIVE mg/dL
Leukocytes,Ua: NEGATIVE
Nitrite: NEGATIVE
Protein, ur: NEGATIVE mg/dL
Specific Gravity, Urine: 1.01 (ref 1.005–1.030)
pH: 8 (ref 5.0–8.0)

## 2020-12-01 LAB — CBC
HCT: 43.6 % (ref 36.0–46.0)
Hemoglobin: 15.5 g/dL — ABNORMAL HIGH (ref 12.0–15.0)
MCH: 34.2 pg — ABNORMAL HIGH (ref 26.0–34.0)
MCHC: 35.6 g/dL (ref 30.0–36.0)
MCV: 96.2 fL (ref 80.0–100.0)
Platelets: 251 10*3/uL (ref 150–400)
RBC: 4.53 MIL/uL (ref 3.87–5.11)
RDW: 12.5 % (ref 11.5–15.5)
WBC: 12.7 10*3/uL — ABNORMAL HIGH (ref 4.0–10.5)
nRBC: 0 % (ref 0.0–0.2)

## 2020-12-01 LAB — PROTEIN / CREATININE RATIO, URINE
Creatinine, Urine: 31.66 mg/dL
Protein Creatinine Ratio: 0.22 mg/mg{Cre} — ABNORMAL HIGH (ref 0.00–0.15)
Total Protein, Urine: 7 mg/dL

## 2020-12-01 MED ORDER — CYCLOBENZAPRINE HCL 5 MG PO TABS
10.0000 mg | ORAL_TABLET | Freq: Once | ORAL | Status: AC
  Administered 2020-12-01: 10 mg via ORAL
  Filled 2020-12-01: qty 2

## 2020-12-01 NOTE — Discharge Instructions (Signed)
Safe Medications in Pregnancy   Acne: Benzoyl Peroxide Salicylic Acid  Backache/Headache: Tylenol: 2 regular strength every 4 hours OR              2 Extra strength every 6 hours  Colds/Coughs/Allergies: Benadryl (alcohol free) 25 mg every 6 hours as needed Breath right strips Claritin Cepacol throat lozenges Chloraseptic throat spray Cold-Eeze- up to three times per day Cough drops, alcohol free Flonase (by prescription only) Guaifenesin Mucinex Robitussin DM (plain only, alcohol free) Saline nasal spray/drops Sudafed (pseudoephedrine) & Actifed ** use only after [redacted] weeks gestation and if you do not have high blood pressure Tylenol Vicks Vaporub Zinc lozenges Zyrtec   Constipation: Colace Ducolax suppositories Fleet enema Glycerin suppositories Metamucil Milk of magnesia Miralax Senokot Smooth move tea  Diarrhea: Kaopectate Imodium A-D  *NO pepto Bismol  Hemorrhoids: Anusol Anusol HC Preparation H Tucks  Indigestion: Tums Maalox Mylanta Zantac  Pepcid  Insomnia: Benadryl (alcohol free) 25mg  every 6 hours as needed Tylenol PM Unisom, no Gelcaps  Leg Cramps: Tums MagGel  Nausea/Vomiting:  Bonine Dramamine Emetrol Ginger extract Sea bands Meclizine  Nausea medication to take during pregnancy:  Unisom (doxylamine succinate 25 mg tablets) Take one tablet daily at bedtime. If symptoms are not adequately controlled, the dose can be increased to a maximum recommended dose of two tablets daily (1/2 tablet in the morning, 1/2 tablet mid-afternoon and one at bedtime). Vitamin B6 100mg  tablets. Take one tablet twice a day (up to 200 mg per day).  Skin Rashes: Aveeno products Benadryl cream or 25mg  every 6 hours as needed Calamine Lotion 1% cortisone cream  Yeast infection: Gyne-lotrimin 7 Monistat 7   **If taking multiple medications, please check labels to avoid duplicating the same active ingredients **take medication as directed on  the label ** Do not exceed 4000 mg of tylenol in 24 hours **Do not take medications that contain aspirin or ibuprofen     Hypertension During Pregnancy High blood pressure (hypertension) is when the force of blood pumping through the arteries is high enough to cause problems with your health. Arteries are blood vessels that carry blood from the heart throughout the body. Hypertension during pregnancy can cause problems for you and your baby. It can be mild or severe. There are different types of hypertension that can happen during pregnancy. These include:  Chronic hypertension. This happens when you had high blood pressure before you became pregnant, and it continues during the pregnancy. Hypertension that develops before you are [redacted] weeks pregnant and continues during the pregnancy is also called chronic hypertension. If you have chronic hypertension, it will not go away after you have your baby. You will need follow-up visits with your health care provider after you have your baby. Your health care provider may want you to keep taking medicine for your blood pressure.  Gestational hypertension. This is hypertension that develops after the 20th week of pregnancy. Gestational hypertension usually goes away after you have your baby, but your health care provider will need to monitor your blood pressure to make sure that it is getting better.  Postpartum hypertension. This is high blood pressure that was present before delivery and continues after delivery or that starts after delivery. This usually occurs within 48 hours after childbirth but may occur up to 6 weeks after giving birth. When hypertension during pregnancy is severe, it is a medical emergency that requires treatment right away. How does this affect me? Women who have hypertension during pregnancy have a greater  chance of developing hypertension later in life or during future pregnancies. In some cases, hypertension during pregnancy can  cause serious complications, such as:  Stroke.  Heart attack.  Injury to other organs, such as kidneys, lungs, or liver.  Preeclampsia.  A condition called hemolysis, elevated liver enzymes, and low platelet count (HELLP) syndrome.  Convulsions or seizures.  Placental abruption. How does this affect my baby? Hypertension during pregnancy can affect your baby. Your baby may:  Be born early (prematurely).  Not weigh as much as he or she should at birth (low birth weight).  Not tolerate labor well, leading to an unplanned cesarean delivery. This condition may also result in a baby's death before birth (stillbirth). What are the risks? There are certain factors that make it more likely for you to develop hypertension during pregnancy. These include:  Having hypertension during a previous pregnancy or a family history of hypertension.  Being overweight.  Being age 29 or older.  Being pregnant for the first time.  Being pregnant with more than one baby.  Becoming pregnant using fertilization methods, such as IVF (in vitro fertilization).  Having other medical problems, such as diabetes, kidney disease, or lupus. What can I do to lower my risk? The exact cause of hypertension during pregnancy is not known. You may be able to lower your risk by:  Maintaining a healthy weight.  Eating a healthy and balanced diet.  Following your health care provider's instructions about treating any long-term conditions that you had before becoming pregnant. It is very important to keep all of your prenatal care appointments. Your health care provider will check your blood pressure and make sure that your pregnancy is progressing as expected. If a problem is found, early treatment can prevent complications.   How is this treated? Treatment for hypertension during pregnancy varies depending on the type of hypertension you have and how serious it is.  If you were taking medicine for high  blood pressure before you became pregnant, talk with your health care provider. You may need to change medicine during pregnancy because some medicines, like ACE inhibitors, may not be considered safe for your baby.  If you have gestational hypertension, your health care provider may order medicine to treat this during pregnancy.  If you are at risk for preeclampsia, your health care provider may recommend that you take a low-dose aspirin during your pregnancy.  If you have severe hypertension, you may need to be hospitalized so you and your baby can be monitored closely. You may also need to be given medicine to lower your blood pressure.  In some cases, if your condition gets worse, you may need to deliver your baby early. Follow these instructions at home: Eating and drinking  Drink enough fluid to keep your urine pale yellow.  Avoid caffeine.   Lifestyle  Do not use any products that contain nicotine or tobacco. These products include cigarettes, chewing tobacco, and vaping devices, such as e-cigarettes. If you need help quitting, ask your health care provider.  Do not use alcohol or drugs.  Avoid stress as much as possible.  Rest and get plenty of sleep.  Regular exercise can help to reduce your blood pressure. Ask your health care provider what kinds of exercise are best for you. General instructions  Take over-the-counter and prescription medicines only as told by your health care provider.  Keep all prenatal and follow-up visits. This is important. Contact a health care provider if:  You have symptoms  that your health care provider told you may require more treatment or monitoring, such as: ? Headaches. ? Nausea or vomiting. ? Abdominal pain. ? Dizziness. ? Light-headedness. Get help right away if:  You have symptoms of serious complications, such as: ? Severe abdominal pain that does not get better with treatment. ? A severe headache that does not get better,  blurred vision, or double vision. ? Vomiting that does not get better. ? Sudden, rapid weight gain or swelling in your hands, ankles, or face. ? Vaginal bleeding. ? Blood in your urine. ? Shortness of breath or chest pain. ? Weakness on one side of your body or difficulty speaking.  Your baby is not moving as much as usual. These symptoms may represent a serious problem that is an emergency. Do not wait to see if the symptoms will go away. Get medical help right away. Call your local emergency services (911 in the [place] Do not drive yourself to the hospital. Summary  Hypertension during pregnancy can cause problems for you and your baby.  Treatment for hypertension during pregnancy varies depending on the type of hypertension you have and how serious it is.  Keep all prenatal and follow-up visits. This is important.  Get help right away if you have symptoms of serious complications related to high blood pressure. This information is not intended to replace advice given to you by your health care provider. Make sure you discuss any questions you have with your health care provider. Document Revised: 05/08/2020 Document Reviewed: 05/08/2020 Elsevier Patient Education  2021 [employer].

## 2020-12-01 NOTE — MAU Provider Note (Signed)
History     CSN: [id number]  Arrival date and time: 12/01/20 1130   Event Date/Time   First Provider Initiated Contact with Patient 12/01/20 1226      Chief Complaint  Patient presents with  . Abdominal Pain   HPI Jocelyn Harrison is a 30 y.o. G1P0 at [redacted]w[redacted]d who presents for contractions. She states she has been having contractions every 10 minutes for the last hour. Denies any leaking or bleeding. Reports normal fetal movement.   On arrival to MAU, patient found to be hypertensive. She denies any hx of hypertension in the pregnancy. Denies any headache, visual changes or epigastric pain.  OB History    Gravida  1   Para      Term      Preterm      AB      Living        SAB      IAB      Ectopic      Multiple      Live Births              No past medical history on file.  Past Surgical History:  Procedure Laterality Date  . FRACTURE SURGERY    . SHOULDER SURGERY      Family History  Problem Relation Age of Onset  . Healthy Mother   . Hypertension Mother   . Hypertension Father   . Heart disease Father   . Diabetes Maternal Grandmother   . Diabetes Maternal Grandfather     Social History   Tobacco Use  . Smoking status: Never Smoker  . Smokeless tobacco: Never Used  Vaping Use  . Vaping Use: Never used  Substance Use Topics  . Alcohol use: Yes    Comment: OCC  . Drug use: Never    Allergies:  Allergies  Allergen Reactions  . Lactose Intolerance (Gi)     Medications Prior to Admission  Medication Sig Dispense Refill Last Dose  . calcium-vitamin D (OSCAL WITH D) 500-200 MG-UNIT tablet Take 1 tablet by mouth.   11/30/2020 at Unknown time  . famotidine (PEPCID) 10 MG tablet Take 10 mg by mouth at bedtime.   11/30/2020 at Unknown time  . Prenatal Vit-Fe Fumarate-FA (PRENATAL MULTIVITAMIN) TABS tablet Take 1 tablet by mouth daily at 12 noon.   11/30/2020 at Unknown time  . polyethylene glycol (MIRALAX / GLYCOLAX) 17 g packet Take 17 g by mouth  daily.       Review of Systems  Constitutional: Negative.  Negative for fatigue and fever.  HENT: Negative.   Respiratory: Negative.  Negative for shortness of breath.   Cardiovascular: Negative.  Negative for chest pain.  Gastrointestinal: Positive for abdominal pain. Negative for constipation, diarrhea, nausea and vomiting.  Genitourinary: Negative.  Negative for dysuria, vaginal bleeding and vaginal discharge.  Neurological: Negative.  Negative for dizziness and headaches.   Physical Exam   Blood pressure (!) 144/95, pulse (!) 107, temperature 98.5 F (36.9 C), temperature source Oral, resp. rate 18, height 5\' 3"  (1.6 m), weight 76.6 kg, last menstrual period 03/04/2020, SpO2 98 %. Patient Vitals for the past 24 hrs:  BP Temp Temp src Pulse Resp SpO2 Height Weight  12/01/20 1417 131/83 -- -- (!) 107 -- -- -- --  12/01/20 1410 -- -- -- -- -- 99 % -- --  12/01/20 1405 -- -- -- -- -- 99 % -- --  12/01/20 1401 122/89 -- -- (!) 102 -- -- -- --  12/01/20 1400 -- -- -- -- -- 99 % -- --  12/01/20 1355 -- -- -- -- -- 99 % -- --  12/01/20 1350 -- -- -- -- -- 99 % -- --  12/01/20 1348 121/72 -- -- (!) 102 -- 100 % -- --  12/01/20 1340 -- -- -- -- -- 99 % -- --  12/01/20 1335 -- -- -- -- -- 99 % -- --  12/01/20 1331 121/76 -- -- 94 -- -- -- --  12/01/20 1330 -- -- -- -- -- 100 % -- --  12/01/20 1325 -- -- -- -- -- 99 % -- --  12/01/20 1320 -- -- -- -- -- 100 % -- --  12/01/20 1316 124/79 -- -- 97 -- -- -- --  12/01/20 1315 -- -- -- -- -- 100 % -- --  12/01/20 1310 -- -- -- -- -- 100 % -- --  12/01/20 1305 -- -- -- -- -- 99 % -- --  12/01/20 1301 (!) 143/98 -- -- (!) 108 -- -- -- --  12/01/20 1300 -- -- -- -- -- 99 % -- --  12/01/20 1255 -- -- -- -- -- 99 % -- --  12/01/20 1250 -- -- -- -- -- 99 % -- --  12/01/20 1246 (!) 149/102 -- -- (!) 114 -- -- -- --  12/01/20 1245 -- -- -- -- -- 99 % -- --  12/01/20 1240 -- -- -- -- -- 99 % -- --  12/01/20 1235 (!) 141/96 -- -- (!) 118 -- 99 %  -- --  12/01/20 1230 -- -- -- -- -- 99 % -- --  12/01/20 1225 -- -- -- -- -- 99 % -- --  12/01/20 1220 -- -- -- -- -- 99 % -- --  12/01/20 1218 (!) 144/95 -- -- (!) 107 -- -- -- --  12/01/20 1215 -- -- -- -- -- 99 % -- --  12/01/20 1210 -- -- -- -- -- 100 % -- --  12/01/20 1158 (!) 133/91 98.5 F (36.9 C) Oral (!) 115 18 98 % 5\' 3"  (1.6 m) 76.6 kg    Physical Exam Vitals and nursing note reviewed.  Constitutional:      General: She is not in acute distress.    Appearance: She is well-developed.  HENT:     Head: Normocephalic.  Eyes:     Pupils: Pupils are equal, round, and reactive to light.  Cardiovascular:     Rate and Rhythm: Normal rate and regular rhythm.     Heart sounds: Normal heart sounds.  Pulmonary:     Effort: Pulmonary effort is normal. No respiratory distress.     Breath sounds: Normal breath sounds.  Abdominal:     General: Bowel sounds are normal. There is no distension.     Palpations: Abdomen is soft.     Tenderness: There is no abdominal tenderness.  Skin:    General: Skin is warm and dry.  Neurological:     Mental Status: She is alert and oriented to person, place, and time.  Psychiatric:        Behavior: Behavior normal.        Thought Content: Thought content normal.        Judgment: Judgment normal.    Fetal Tracing:  Baseline: 120 Variability: moderate Accels: 15x15 Decels: none  Toco: none  Dilation: Closed Effacement (%): Thick Exam by:: T LYTLE RN   MAU Course  Procedures Results for orders placed or performed during the hospital  encounter of 12/01/20 (from the past 24 hour(s))  CBC     Status: Abnormal   Collection Time: 12/01/20 12:39 PM  Result Value Ref Range   WBC 12.7 (H) 4.0 - 10.5 K/uL   RBC 4.53 3.87 - 5.11 MIL/uL   Hemoglobin 15.5 (H) 12.0 - 15.0 g/dL   HCT 60.6 30.1 - 60.1 %   MCV 96.2 80.0 - 100.0 fL   MCH 34.2 (H) 26.0 - 34.0 pg   MCHC 35.6 30.0 - 36.0 g/dL   RDW 09.3 23.5 - 57.3 %   Platelets 251 150 - 400  K/uL   nRBC 0.0 0.0 - 0.2 %  Comprehensive metabolic panel     Status: Abnormal   Collection Time: 12/01/20 12:39 PM  Result Value Ref Range   Sodium 135 135 - 145 mmol/L   Potassium 4.3 3.5 - 5.1 mmol/L   Chloride 103 98 - 111 mmol/L   CO2 23 22 - 32 mmol/L   Glucose, Bld 86 70 - 99 mg/dL   BUN 8 6 - 20 mg/dL   Creatinine, Ser 2.20 0.44 - 1.00 mg/dL   Calcium 9.3 8.9 - 25.4 mg/dL   Total Protein 6.3 (L) 6.5 - 8.1 g/dL   Albumin 2.9 (L) 3.5 - 5.0 g/dL   AST 18 15 - 41 U/L   ALT 16 0 - 44 U/L   Alkaline Phosphatase 153 (H) 38 - 126 U/L   Total Bilirubin 0.4 0.3 - 1.2 mg/dL   GFR, Estimated >27 >06 mL/min   Anion gap 9 5 - 15  Protein / creatinine ratio, urine     Status: Abnormal   Collection Time: 12/01/20 12:43 PM  Result Value Ref Range   Creatinine, Urine 31.66 mg/dL   Total Protein, Urine 7 mg/dL   Protein Creatinine Ratio 0.22 (H) 0.00 - 0.15 mg/mg[Cre]  Urinalysis, Routine w reflex microscopic Urine, Clean Catch     Status: None   Collection Time: 12/01/20 12:43 PM  Result Value Ref Range   Color, Urine YELLOW YELLOW   APPearance CLEAR CLEAR   Specific Gravity, Urine 1.010 1.005 - 1.030   pH 8.0 5.0 - 8.0   Glucose, UA NEGATIVE NEGATIVE mg/dL   Hgb urine dipstick NEGATIVE NEGATIVE   Bilirubin Urine NEGATIVE NEGATIVE   Ketones, ur NEGATIVE NEGATIVE mg/dL   Protein, ur NEGATIVE NEGATIVE mg/dL   Nitrite NEGATIVE NEGATIVE   Leukocytes,Ua NEGATIVE NEGATIVE   MDM Prenatal records from private office reviewed. Pregnancy complicated by conception by IUI, COVID in second trimester. Labs ordered and reviewed.  UA CBC, CMP, Protein/creat ratio  Consulted with Dr. Adrian Blackwater- recommends discharge home and follow up as scheduled on Wednesday in the office.  Dr. Claiborne Billings notified of patient arrival and complaint with new onset of HTN- agrees with plan of care to have patient follow up Wednesday as scheduled with strict return precautions  Assessment and Plan   1. Elevated  blood pressure affecting pregnancy in third trimester, antepartum   2. [redacted] weeks gestation of pregnancy    -Discharge home in stable condition -Preeclampsia precautions discussed -Patient advised to follow-up with OB as scheduled on Wednesday -Patient may return to MAU as needed or if her condition were to change or worsen   Rolm Bookbinder CNM 12/01/2020, 12:26 PM

## 2020-12-01 NOTE — MAU Note (Signed)
Has had increased vaginal pain all weekend.  Has been having menstrual like cramping today, that just keep coming, about q 10 min now. Low back is starting to hurt also .  Reports +FM. Denies bleeding or ROM.

## 2020-12-03 ENCOUNTER — Encounter (HOSPITAL_COMMUNITY): Payer: Self-pay | Admitting: Obstetrics

## 2020-12-03 ENCOUNTER — Inpatient Hospital Stay (HOSPITAL_COMMUNITY)
Admission: AD | Admit: 2020-12-03 | Discharge: 2020-12-06 | DRG: 788 | Disposition: A | Discharge status: Home / Self Care | Payer: Federal, State, Local not specified - PPO | Attending: Obstetrics and Gynecology | Admitting: Obstetrics and Gynecology

## 2020-12-03 ENCOUNTER — Other Ambulatory Visit: Payer: Self-pay

## 2020-12-03 DIAGNOSIS — Z8616 Personal history of COVID-19: Secondary | ICD-10-CM

## 2020-12-03 DIAGNOSIS — Z3A39 39 weeks gestation of pregnancy: Secondary | ICD-10-CM

## 2020-12-03 DIAGNOSIS — O133 Gestational [pregnancy-induced] hypertension without significant proteinuria, third trimester: Secondary | ICD-10-CM | POA: Diagnosis present

## 2020-12-03 DIAGNOSIS — O134 Gestational [pregnancy-induced] hypertension without significant proteinuria, complicating childbirth: Principal | ICD-10-CM | POA: Diagnosis present

## 2020-12-03 LAB — CBC
HCT: 39.4 % (ref 36.0–46.0)
Hemoglobin: 14.1 g/dL (ref 12.0–15.0)
MCH: 33.9 pg (ref 26.0–34.0)
MCHC: 35.8 g/dL (ref 30.0–36.0)
MCV: 94.7 fL (ref 80.0–100.0)
Platelets: 237 10*3/uL (ref 150–400)
RBC: 4.16 MIL/uL (ref 3.87–5.11)
RDW: 12.1 % (ref 11.5–15.5)
WBC: 12.1 10*3/uL — ABNORMAL HIGH (ref 4.0–10.5)
nRBC: 0 % (ref 0.0–0.2)

## 2020-12-03 LAB — COMPREHENSIVE METABOLIC PANEL
ALT: 17 U/L (ref 0–44)
AST: 19 U/L (ref 15–41)
Albumin: 2.8 g/dL — ABNORMAL LOW (ref 3.5–5.0)
Alkaline Phosphatase: 155 U/L — ABNORMAL HIGH (ref 38–126)
Anion gap: 7 (ref 5–15)
BUN: 10 mg/dL (ref 6–20)
CO2: 22 mmol/L (ref 22–32)
Calcium: 9.2 mg/dL (ref 8.9–10.3)
Chloride: 104 mmol/L (ref 98–111)
Creatinine, Ser: 0.4 mg/dL — ABNORMAL LOW (ref 0.44–1.00)
GFR, Estimated: >60 mL/min (ref >60)
Glucose, Bld: 101 mg/dL — ABNORMAL HIGH (ref 70–99)
Potassium: 3.7 mmol/L (ref 3.5–5.1)
Sodium: 133 mmol/L — ABNORMAL LOW (ref 135–145)
Total Bilirubin: 0.4 mg/dL (ref 0.3–1.2)
Total Protein: 6.1 g/dL — ABNORMAL LOW (ref 6.5–8.1)

## 2020-12-03 LAB — RESP PANEL BY RT-PCR (FLU A&B, COVID) ARPGX2
Influenza A by PCR: NEGATIVE
Influenza B by PCR: NEGATIVE
SARS Coronavirus 2 by RT PCR: NEGATIVE

## 2020-12-03 LAB — PROTEIN / CREATININE RATIO, URINE
Creatinine, Urine: 48.93 mg/dL
Protein Creatinine Ratio: 0.12 mg/mg{Cre} (ref 0.00–0.15)
Total Protein, Urine: 6 mg/dL

## 2020-12-03 LAB — TYPE AND SCREEN
ABO/RH(D): O POS
Antibody Screen: NEGATIVE

## 2020-12-03 MED ORDER — TERBUTALINE SULFATE 1 MG/ML IJ SOLN: 0.2500 mg | Freq: Once | INTRAMUSCULAR | Status: DC | PRN

## 2020-12-03 MED ORDER — MISOPROSTOL 25 MCG QUARTER TABLET
25.0000 ug | ORAL_TABLET | ORAL | Status: DC | PRN
  Administered 2020-12-03 – 2020-12-04 (×3): 25 ug via VAGINAL
  Filled 2020-12-03 (×3): qty 1

## 2020-12-03 MED ORDER — TERBUTALINE SULFATE 1 MG/ML IJ SOLN
0.2500 mg | Freq: Once | INTRAMUSCULAR | Status: DC | PRN
  Filled 2020-12-03: qty 1

## 2020-12-03 MED ORDER — LACTATED RINGERS IV SOLN: INTRAVENOUS | Status: DC

## 2020-12-03 MED ORDER — ONDANSETRON HCL 4 MG/2ML IJ SOLN
4.0000 mg | Freq: Four times a day (QID) | INTRAMUSCULAR | Status: DC | PRN
  Administered 2020-12-04: 4 mg via INTRAVENOUS
  Filled 2020-12-03: qty 2

## 2020-12-03 MED ORDER — SOD CITRATE-CITRIC ACID 500-334 MG/5ML PO SOLN
30.0000 mL | ORAL | Status: DC | PRN
  Administered 2020-12-04: 30 mL via ORAL
  Filled 2020-12-03: qty 15

## 2020-12-03 MED ORDER — OXYCODONE-ACETAMINOPHEN 5-325 MG PO TABS: 2.0000 | ORAL_TABLET | ORAL | Status: DC | PRN

## 2020-12-03 MED ORDER — OXYTOCIN-SODIUM CHLORIDE 30-0.9 UT/500ML-% IV SOLN
2.5000 [IU]/h | INTRAVENOUS | Status: DC
  Filled 2020-12-03: qty 500

## 2020-12-03 MED ORDER — OXYCODONE-ACETAMINOPHEN 5-325 MG PO TABS
1.0000 | ORAL_TABLET | ORAL | Status: DC | PRN
Start: 2020-12-03 — End: 2020-12-04

## 2020-12-03 MED ORDER — LIDOCAINE HCL (PF) 1 % IJ SOLN: 30.0000 mL | INTRAMUSCULAR | Status: DC | PRN

## 2020-12-03 MED ORDER — FAMOTIDINE 20 MG PO TABS
20.0000 mg | ORAL_TABLET | Freq: Two times a day (BID) | ORAL | Status: DC | PRN
  Administered 2020-12-03: 20 mg via ORAL
  Filled 2020-12-03: qty 1

## 2020-12-03 MED ORDER — OXYTOCIN BOLUS FROM INFUSION: 333.0000 mL | Freq: Once | INTRAVENOUS | Status: DC

## 2020-12-03 MED ORDER — ACETAMINOPHEN 325 MG PO TABS: 650.0000 mg | ORAL_TABLET | ORAL | Status: DC | PRN

## 2020-12-03 MED ORDER — FENTANYL CITRATE (PF) 100 MCG/2ML IJ SOLN
50.0000 ug | INTRAMUSCULAR | Status: DC | PRN
Start: 2020-12-03 — End: 2020-12-04
  Administered 2020-12-04 (×3): 100 ug via INTRAVENOUS
  Filled 2020-12-03 (×4): qty 2

## 2020-12-03 MED ORDER — LACTATED RINGERS IV SOLN
500.0000 mL | INTRAVENOUS | Status: DC | PRN
Start: 2020-12-03 — End: 2020-12-04

## 2020-12-03 NOTE — H&P (Signed)
30 y.o. G1P0 @ [redacted]w[redacted]d presents from the office for induction of labor for hypertension at term.  She was seen in the maternity admissions unit on 12/01/20 for cramping.  At that time, was noticed to have newly elevated BP in the 130-140s/90-100s initially that normalized.  Did not meet criteria for 2 elevated BPs over 4 hours.  She presented today for a follow up visit and blood pressure was noted to be 148/92.  She was sent to L&D for induction.  On presentation, she denies headache, visual changes, epigastric or right upper quadrant pain.  Otherwise has good fetal movement and no bleeding.  Pregnancy complicated by: 1. Primary female infertility:  Conceived by IUD with donor sperm.  Husband with diagnosis of azospermia 2. Covid-19 infection: at 15 weeks.  Received monoclonal antibodies 3. Low lying placenta: resolved at 30 weeks  History reviewed. No pertinent past medical history.  Past Surgical History:  Procedure Laterality Date  . FRACTURE SURGERY    . SHOULDER SURGERY      OB History  Gravida Para Term Preterm AB Living  1            SAB IAB Ectopic Multiple Live Births               # Outcome Date GA Lbr Len/2nd Weight Sex Delivery Anes PTL Lv  1 Current             Social History   Socioeconomic History  . Marital status: Married    Spouse name: Not on file  . Number of children: Not on file  . Years of education: Not on file  . Highest education level: Not on file  Occupational History  . Not on file  Tobacco Use  . Smoking status: Never Smoker  . Smokeless tobacco: Never Used  Vaping Use  . Vaping Use: Never used  Substance and Sexual Activity  . Alcohol use: Yes    Comment: OCC  . Drug use: Never  . Sexual activity: Yes    Birth control/protection: Injection    Comment: INTERCOURSE AGE 47, MORE THAN 5 SEXUAL PARTERNS  Other Topics Concern  . Not on file  Social History Narrative   ** Merged History Encounter **       Social Determinants of Health    Lactose  intolerance (gi)    Prenatal Transfer Tool  Maternal Diabetes: No Genetic Screening: Normal Maternal Ultrasounds/Referrals: Normal Fetal Ultrasounds or other Referrals:  None Maternal Substance Abuse:  No Significant Maternal Medications:  Meds include: Other:  pepcid Significant Maternal Lab Results: Group B Strep negative  ABO, Rh: --/--/O POS (04/06 1514) Antibody: NEG (04/06 1514)  GBS: Negative/-- (03/18 1158)     Vitals:   12/03/20 1530 12/03/20 1632  BP: 131/90 (!) 136/92  Pulse: (!) 102 96     General:  NAD Abdomen:  soft, gravid, EFW 6# Ex:  trace edema SVE:  Closed/long/posterior/soft/high.  FHTs:  130s, moderate variability, + accelerations, category 1 Toco:  quiet   Cephalic presentation confirmed by limited BSUS in office  A/P   30 y.o. G1P0 [redacted]w[redacted]d presents with newly elevated blood pressures at term Admit to L&D Gestational hypertension:  Will check cbc, cmp, up:c.  No signs/symptoms of severe features at this time Will start induction of labor with cervical ripening with misoprostol Epidural upon request  FSR/ vtx/ GBS negative  Kristion Holifield GEFFEL Koral Thaden

## 2020-12-03 NOTE — Progress Notes (Signed)
Dr. Chestine Spore called- SVE, FHR and UC pattern reported.  MD reviewed strip- would like Cytotec placed at this time.

## 2020-12-03 NOTE — Progress Notes (Signed)
Dr. Kemper Durie called  for update on pt. FHR, UC's and pain reported to provider.  Requested RN to check patient and call back with report.

## 2020-12-04 ENCOUNTER — Encounter (HOSPITAL_COMMUNITY): Payer: Self-pay | Admitting: Obstetrics

## 2020-12-04 ENCOUNTER — Encounter (HOSPITAL_COMMUNITY)
Admission: AD | Disposition: A | Discharge status: Home / Self Care | Payer: Self-pay | Source: Home / Self Care | Attending: Obstetrics and Gynecology

## 2020-12-04 ENCOUNTER — Inpatient Hospital Stay (HOSPITAL_COMMUNITY): Payer: Federal, State, Local not specified - PPO | Admitting: Anesthesiology

## 2020-12-04 LAB — CBC
HCT: 39.4 % (ref 36.0–46.0)
Hemoglobin: 14 g/dL (ref 12.0–15.0)
MCH: 33.7 pg (ref 26.0–34.0)
MCHC: 35.5 g/dL (ref 30.0–36.0)
MCV: 94.9 fL (ref 80.0–100.0)
Platelets: 201 10*3/uL (ref 150–400)
RBC: 4.15 MIL/uL (ref 3.87–5.11)
RDW: 12.2 % (ref 11.5–15.5)
WBC: 14.3 10*3/uL — ABNORMAL HIGH (ref 4.0–10.5)
nRBC: 0 % (ref 0.0–0.2)

## 2020-12-04 LAB — RPR: RPR Ser Ql: NONREACTIVE

## 2020-12-04 SURGERY — Surgical Case
Anesthesia: Epidural

## 2020-12-04 MED ORDER — SIMETHICONE 80 MG PO CHEW
80.0000 mg | CHEWABLE_TABLET | ORAL | Status: DC | PRN
  Filled 2020-12-04: qty 1

## 2020-12-04 MED ORDER — LACTATED RINGERS IV SOLN: 500.0000 mL | Freq: Once | INTRAVENOUS | Status: DC

## 2020-12-04 MED ORDER — OXYTOCIN-SODIUM CHLORIDE 30-0.9 UT/500ML-% IV SOLN
INTRAVENOUS | Status: AC
  Filled 2020-12-04: qty 500

## 2020-12-04 MED ORDER — KETOROLAC TROMETHAMINE 30 MG/ML IJ SOLN: 30.0000 mg | Freq: Once | INTRAMUSCULAR | Status: DC

## 2020-12-04 MED ORDER — ONDANSETRON HCL 4 MG/2ML IJ SOLN: 4.0000 mg | Freq: Three times a day (TID) | INTRAMUSCULAR | Status: DC | PRN

## 2020-12-04 MED ORDER — TERBUTALINE SULFATE 1 MG/ML IJ SOLN
0.2500 mg | Freq: Once | INTRAMUSCULAR | Status: AC
  Administered 2020-12-04: 0.25 mg via SUBCUTANEOUS

## 2020-12-04 MED ORDER — METOCLOPRAMIDE HCL 5 MG/ML IJ SOLN
INTRAMUSCULAR | Status: AC
  Filled 2020-12-04: qty 2

## 2020-12-04 MED ORDER — FENTANYL-BUPIVACAINE-NACL 0.5-0.125-0.9 MG/250ML-% EP SOLN
12.0000 mL/h | EPIDURAL | Status: DC | PRN
  Administered 2020-12-04: 12 mL/h via EPIDURAL
  Filled 2020-12-04: qty 250

## 2020-12-04 MED ORDER — ACETAMINOPHEN 500 MG PO TABS: 1000.0000 mg | ORAL_TABLET | Freq: Once | ORAL | Status: DC

## 2020-12-04 MED ORDER — MORPHINE SULFATE (PF) 0.5 MG/ML IJ SOLN
INTRAMUSCULAR | Status: AC
  Filled 2020-12-04: qty 10

## 2020-12-04 MED ORDER — FENTANYL CITRATE (PF) 100 MCG/2ML IJ SOLN: 25.0000 ug | INTRAMUSCULAR | Status: DC | PRN

## 2020-12-04 MED ORDER — DIPHENHYDRAMINE HCL 25 MG PO CAPS: 25.0000 mg | ORAL_CAPSULE | Freq: Four times a day (QID) | ORAL | Status: DC | PRN

## 2020-12-04 MED ORDER — EPHEDRINE 5 MG/ML INJ: 10.0000 mg | INTRAVENOUS | Status: DC | PRN

## 2020-12-04 MED ORDER — KETOROLAC TROMETHAMINE 30 MG/ML IJ SOLN
30.0000 mg | Freq: Four times a day (QID) | INTRAMUSCULAR | Status: AC
  Administered 2020-12-04 – 2020-12-05 (×3): 30 mg via INTRAVENOUS
  Filled 2020-12-04 (×4): qty 1

## 2020-12-04 MED ORDER — KETOROLAC TROMETHAMINE 30 MG/ML IJ SOLN: 30.0000 mg | Freq: Four times a day (QID) | INTRAMUSCULAR | Status: DC | PRN

## 2020-12-04 MED ORDER — ACETAMINOPHEN 500 MG PO TABS: 1000.0000 mg | ORAL_TABLET | Freq: Four times a day (QID) | ORAL | Status: DC

## 2020-12-04 MED ORDER — ONDANSETRON HCL 4 MG/2ML IJ SOLN
INTRAMUSCULAR | Status: DC | PRN
  Administered 2020-12-04: 4 mg via INTRAVENOUS

## 2020-12-04 MED ORDER — PHENYLEPHRINE 40 MCG/ML (10ML) SYRINGE FOR IV PUSH (FOR BLOOD PRESSURE SUPPORT): 80.0000 ug | PREFILLED_SYRINGE | INTRAVENOUS | Status: DC | PRN

## 2020-12-04 MED ORDER — PHENYLEPHRINE 40 MCG/ML (10ML) SYRINGE FOR IV PUSH (FOR BLOOD PRESSURE SUPPORT)
PREFILLED_SYRINGE | INTRAVENOUS | Status: AC
  Filled 2020-12-04: qty 10

## 2020-12-04 MED ORDER — OXYCODONE-ACETAMINOPHEN 5-325 MG PO TABS
1.0000 | ORAL_TABLET | ORAL | Status: DC | PRN
  Administered 2020-12-05: 1 via ORAL
  Filled 2020-12-04: qty 1

## 2020-12-04 MED ORDER — MENTHOL 3 MG MT LOZG
1.0000 | LOZENGE | OROMUCOSAL | Status: DC | PRN
  Filled 2020-12-04: qty 9

## 2020-12-04 MED ORDER — NALOXONE HCL 4 MG/10ML IJ SOLN
1.0000 ug/kg/h | INTRAVENOUS | Status: DC | PRN
  Filled 2020-12-04: qty 5

## 2020-12-04 MED ORDER — COCONUT OIL OIL
1.0000 "application " | TOPICAL_OIL | Status: DC | PRN
  Administered 2020-12-05: 1 via TOPICAL
  Filled 2020-12-04: qty 120

## 2020-12-04 MED ORDER — PROMETHAZINE HCL 25 MG/ML IJ SOLN
6.2500 mg | INTRAMUSCULAR | Status: DC | PRN
Start: 2020-12-04 — End: 2020-12-04

## 2020-12-04 MED ORDER — ACETAMINOPHEN 160 MG/5ML PO SOLN: 1000.0000 mg | Freq: Once | ORAL | Status: DC

## 2020-12-04 MED ORDER — SENNOSIDES-DOCUSATE SODIUM 8.6-50 MG PO TABS
2.0000 | ORAL_TABLET | ORAL | Status: DC
  Administered 2020-12-05 – 2020-12-06 (×2): 2 via ORAL
  Filled 2020-12-04 (×4): qty 2

## 2020-12-04 MED ORDER — STERILE WATER FOR IRRIGATION IR SOLN
Status: DC | PRN
  Administered 2020-12-04: 1

## 2020-12-04 MED ORDER — SODIUM CHLORIDE 0.9 % IV SOLN
INTRAVENOUS | Status: DC | PRN
  Administered 2020-12-04: 500 mg via INTRAVENOUS

## 2020-12-04 MED ORDER — CEFAZOLIN SODIUM-DEXTROSE 2-4 GM/100ML-% IV SOLN: 2.0000 g | INTRAVENOUS | Status: DC

## 2020-12-04 MED ORDER — NALBUPHINE HCL 10 MG/ML IJ SOLN: 5.0000 mg | Freq: Once | INTRAMUSCULAR | Status: DC | PRN

## 2020-12-04 MED ORDER — FENTANYL CITRATE (PF) 100 MCG/2ML IJ SOLN
INTRAMUSCULAR | Status: DC | PRN
  Administered 2020-12-04: 100 ug via EPIDURAL

## 2020-12-04 MED ORDER — LIDOCAINE HCL (PF) 1 % IJ SOLN
INTRAMUSCULAR | Status: DC | PRN
  Administered 2020-12-04 (×2): 4 mL via EPIDURAL

## 2020-12-04 MED ORDER — ONDANSETRON HCL 4 MG/2ML IJ SOLN
INTRAMUSCULAR | Status: AC
  Filled 2020-12-04: qty 2

## 2020-12-04 MED ORDER — SODIUM CHLORIDE 0.9% FLUSH: 3.0000 mL | INTRAVENOUS | Status: DC | PRN

## 2020-12-04 MED ORDER — NALBUPHINE HCL 10 MG/ML IJ SOLN: 5.0000 mg | INTRAMUSCULAR | Status: DC | PRN

## 2020-12-04 MED ORDER — ZOLPIDEM TARTRATE 5 MG PO TABS: 5.0000 mg | ORAL_TABLET | Freq: Every evening | ORAL | Status: DC | PRN

## 2020-12-04 MED ORDER — IBUPROFEN 800 MG PO TABS
800.0000 mg | ORAL_TABLET | Freq: Four times a day (QID) | ORAL | Status: DC
  Administered 2020-12-05 – 2020-12-06 (×5): 800 mg via ORAL
  Filled 2020-12-04 (×5): qty 1

## 2020-12-04 MED ORDER — MORPHINE SULFATE (PF) 0.5 MG/ML IJ SOLN
INTRAMUSCULAR | Status: DC | PRN
  Administered 2020-12-04: 3 mg via EPIDURAL

## 2020-12-04 MED ORDER — DIPHENHYDRAMINE HCL 50 MG/ML IJ SOLN: 12.5000 mg | INTRAMUSCULAR | Status: DC | PRN

## 2020-12-04 MED ORDER — SIMETHICONE 80 MG PO CHEW
80.0000 mg | CHEWABLE_TABLET | Freq: Three times a day (TID) | ORAL | Status: DC
  Administered 2020-12-04 – 2020-12-06 (×5): 80 mg via ORAL
  Filled 2020-12-04 (×6): qty 1

## 2020-12-04 MED ORDER — TERBUTALINE SULFATE 1 MG/ML IJ SOLN: 0.2500 mg | Freq: Once | INTRAMUSCULAR | Status: DC | PRN

## 2020-12-04 MED ORDER — LACTATED RINGERS IV SOLN: INTRAVENOUS | Status: DC | PRN

## 2020-12-04 MED ORDER — SODIUM CHLORIDE 0.9 % IV SOLN
INTRAVENOUS | Status: AC
  Filled 2020-12-04: qty 500

## 2020-12-04 MED ORDER — DIPHENHYDRAMINE HCL 50 MG/ML IJ SOLN: 12.5000 mg | Freq: Four times a day (QID) | INTRAMUSCULAR | Status: DC | PRN

## 2020-12-04 MED ORDER — LIDOCAINE-EPINEPHRINE (PF) 2 %-1:200000 IJ SOLN
INTRAMUSCULAR | Status: DC | PRN
  Administered 2020-12-04 (×3): 5 mL via EPIDURAL

## 2020-12-04 MED ORDER — PHENYLEPHRINE HCL (PRESSORS) 10 MG/ML IV SOLN
INTRAVENOUS | Status: DC | PRN
  Administered 2020-12-04 (×9): 80 ug via INTRAVENOUS

## 2020-12-04 MED ORDER — OXYTOCIN-SODIUM CHLORIDE 30-0.9 UT/500ML-% IV SOLN
2.5000 [IU]/h | INTRAVENOUS | Status: AC
  Filled 2020-12-04: qty 500

## 2020-12-04 MED ORDER — FENTANYL CITRATE (PF) 100 MCG/2ML IJ SOLN
INTRAMUSCULAR | Status: AC
  Filled 2020-12-04: qty 2

## 2020-12-04 MED ORDER — PRENATAL MULTIVITAMIN CH
1.0000 | ORAL_TABLET | Freq: Every day | ORAL | Status: DC
  Administered 2020-12-05 – 2020-12-06 (×2): 1 via ORAL
  Filled 2020-12-04 (×2): qty 1

## 2020-12-04 MED ORDER — DEXAMETHASONE SODIUM PHOSPHATE 4 MG/ML IJ SOLN
INTRAMUSCULAR | Status: AC
  Filled 2020-12-04: qty 1

## 2020-12-04 MED ORDER — LACTATED RINGERS IV SOLN
500.0000 mL | Freq: Once | INTRAVENOUS | Status: AC
  Administered 2020-12-04: 500 mL via INTRAVENOUS

## 2020-12-04 MED ORDER — ALBUMIN HUMAN 5 % IV SOLN: INTRAVENOUS | Status: DC | PRN

## 2020-12-04 MED ORDER — OXYTOCIN-SODIUM CHLORIDE 30-0.9 UT/500ML-% IV SOLN
INTRAVENOUS | Status: DC | PRN
  Administered 2020-12-04: 300 mL via INTRAVENOUS

## 2020-12-04 MED ORDER — WITCH HAZEL-GLYCERIN EX PADS: 1.0000 "application " | MEDICATED_PAD | CUTANEOUS | Status: DC | PRN

## 2020-12-04 MED ORDER — FLEET ENEMA 7-19 GM/118ML RE ENEM: 1.0000 | ENEMA | Freq: Every day | RECTAL | Status: DC | PRN

## 2020-12-04 MED ORDER — DIBUCAINE (PERIANAL) 1 % EX OINT
1.0000 "application " | TOPICAL_OINTMENT | CUTANEOUS | Status: DC | PRN
  Filled 2020-12-04: qty 28

## 2020-12-04 MED ORDER — DEXAMETHASONE SODIUM PHOSPHATE 4 MG/ML IJ SOLN
INTRAMUSCULAR | Status: DC | PRN
  Administered 2020-12-04: 8 mg via INTRAVENOUS

## 2020-12-04 MED ORDER — OXYTOCIN-SODIUM CHLORIDE 30-0.9 UT/500ML-% IV SOLN
1.0000 m[IU]/min | INTRAVENOUS | Status: DC
Start: 2020-12-04 — End: 2020-12-04
  Administered 2020-12-04: 2 m[IU]/min via INTRAVENOUS

## 2020-12-04 MED ORDER — NALOXONE HCL 0.4 MG/ML IJ SOLN: 0.4000 mg | INTRAMUSCULAR | Status: DC | PRN

## 2020-12-04 MED ORDER — LACTATED RINGERS IV SOLN: INTRAVENOUS | Status: DC

## 2020-12-04 MED ORDER — SODIUM CHLORIDE 0.9 % IR SOLN
Status: DC | PRN
Start: 2020-12-04 — End: 2020-12-04
  Administered 2020-12-04: 1

## 2020-12-04 MED ORDER — ALBUMIN HUMAN 5 % IV SOLN
INTRAVENOUS | Status: AC
  Filled 2020-12-04: qty 250

## 2020-12-04 MED ORDER — BISACODYL 10 MG RE SUPP
10.0000 mg | Freq: Every day | RECTAL | Status: DC | PRN
Start: 2020-12-04 — End: 2020-12-06
  Filled 2020-12-04: qty 1

## 2020-12-04 MED ORDER — ACETAMINOPHEN 500 MG PO TABS
1000.0000 mg | ORAL_TABLET | Freq: Four times a day (QID) | ORAL | Status: AC
  Administered 2020-12-04 – 2020-12-05 (×4): 1000 mg via ORAL
  Filled 2020-12-04 (×4): qty 2

## 2020-12-04 SURGICAL SUPPLY — 3 items
BENZOIN TINCTURE PRP APPL 2/3 (GAUZE/BANDAGES/DRESSINGS) ×2 IMPLANT
DRSG OPSITE POSTOP 4X10 (GAUZE/BANDAGES/DRESSINGS) ×2 IMPLANT
STRIP CLOSURE SKIN 1/2X4 (GAUZE/BANDAGES/DRESSINGS) ×2 IMPLANT

## 2020-12-04 NOTE — Op Note (Signed)
CESAREAN SECTION Procedure Note  Preoperative Diagnosis: Jocelyn Harrison is a 30 y.o. G1P1001 @ [redacted]w[redacted]d with 1) nonreassuring fetal heart tracing and 2) gestational hypertension  Postoperative Diagnosis: same, delivered  Procedure: primary cesarean     Surgeon: Charlett Nose , MD   Anesthesia: Spinal anesthesia   Findings: Normal appearing uterus, fallopian tubes bilaterally, and ovaries bilaterally. Appendix visualized and normal.  Viable female infant in vertex, ROA, asynclitic presentation delivered at 1140 with weight 2730g (6pounds 0.3ounces) Apgars 9 and 9. Umbilical cord in true knot.  Estimated Blood Loss:          Specimens: Placenta to L&D for disposal         Complications:  None         Disposition: PACU - hemodynamically stable.         Condition: stable    Description of Procedure: Due to recurrent decelerations despite resuscitative maneuvers, discontinuing pitocin, and administration of terbutaline, and no progress past 7cm with asynclitic presentation, decision was made to proceed with primary cesarean. Prior to disconnecting patient from fetal monitoring in labor room, heart rate noted in the 70s. She was brought quickly to the operating room. In the operating room, heart rate was in 120s.  Epidural anesthesia was rebolused and found to be adequate.  The patient was placed in the dorsal supine position.  Fetal heart tones were 126bpm. Thromboguards were applied and cycling. A foley catheter was in place. Ancef 2g and azithromycin 500mg  were given for infection prophylaxis. The patient was subsequently prepped and draped in the normal sterile fashion.    A low transverse skin incision was made with a scalpel and carried down to the level of the fascia.  The fascia was incised in the midline with the scalpel and extended laterally with curved Mayo scissors.  Kocher clamps were applied to the inferior fascial edge and the fascia was dissected off the rectus muscle  sharply using the Mayo scissors.  The Kocher clamps were transferred to the superior fascial edge and the underlying rectus muscle was dissected off with curved Mayo's scissors.  The rectus muscles then were separated in the midline.  The peritoneum was found free of adherent bowel and the peritoneal cavity was entered with Metzenbaum scissors.  The uterus was identified and the alexis retractor was placed intraperitoneal.  A bladder flap was then created sharply with Metzenbaum scissors and separated from the lower uterine segment digitally.   A low transverse hysterotomy was then made with a scalpel.  The infant was found in the vertex presentation was delivered atraumatically and without difficulty with standard maneuvers.After 60 seconds of delayed cord clamping the cord was clamped and cut and the infant was handed off to the pediatricians.  The placenta was delivered with gentle traction on umbilical cord and manual massage of the uterine fundus.  The uterus was cleared of all clot and debris.  The hysterotomy was then closed with 0 monocryl in a running locked fashion,  followed by 0 Monocryl in an imbricating fashion.  The hysterotomy was found to be hemostatic. The fascia was closed with a 0 Vicryl suture in a continuous running fashion.  The subcutaneous tissue was irrigated and rendered hemostatic with cautery.  The subcutaneous layer was subsequently closed with 3-0 Vicryl in a continuous running fashion.  The skin was closed with 4-0 vicryl  in a running subcuticular fashion.  Sponge, lap and needle counts were correct. Steri strips and a Honeycomb dressing were placed  on the incision.   Charlett Nose 09/19/20 12:10 PM

## 2020-12-04 NOTE — Progress Notes (Signed)
OB Progress Note  S: Foley balloon fell out around 0730, exam by RN at that time 4/100/0, no membranes felt. Patient thinks she noted clear LOF at 26.  Patient comfortable s/p epidural  O:  SVE 6/100/0, no membranes felt  FHR 115bpm, moderate variability, + accels, late decels noted while patient supine during cervical exam, resolved with change to lateral position Toco: ctx q 3-4 mins  A/P 29Y G1P0 @ [redacted]w[redacted]d, IOL gestational HTN 1. Labor: now in active labor, s/p SROM, continue pitocin, currently at 63mu/min.  2. GHTN: normal to mild range BPS 3. Fetal monitoring: Cat 1-2 tracing, decels resolved with position change, + scalp stim 4. Pain control: epidural  M. Timothy Lasso, MD 12/04/20 10:04 AM

## 2020-12-04 NOTE — Anesthesia Preprocedure Evaluation (Addendum)
Anesthesia Evaluation  Patient identified by MRN, date of birth, ID band Patient awake    Reviewed: Allergy & Precautions, NPO status , Patient's Chart, lab work & pertinent test results  History of Anesthesia Complications Negative for: history of anesthetic complications  Airway Mallampati: II  TM Distance: >3 FB Neck ROM: Full    Dental no notable dental hx.    Pulmonary neg pulmonary ROS,    Pulmonary exam normal        Cardiovascular hypertension (gestational), Normal cardiovascular exam     Neuro/Psych negative neurological ROS  negative psych ROS   GI/Hepatic negative GI ROS, Neg liver ROS,   Endo/Other  negative endocrine ROS  Renal/GU negative Renal ROS  negative genitourinary   Musculoskeletal negative musculoskeletal ROS (+)   Abdominal   Peds  Hematology negative hematology ROS (+)   Anesthesia Other Findings Day of surgery medications reviewed with patient.  Reproductive/Obstetrics (+) Pregnancy                            Anesthesia Physical Anesthesia Plan  ASA: II and emergent  Anesthesia Plan: Epidural   Post-op Pain Management:    Induction:   PONV Risk Score and Plan: Treatment may vary due to age or medical condition  Airway Management Planned: Natural Airway  Additional Equipment:   Intra-op Plan:   Post-operative Plan:   Informed Consent: I have reviewed the patients History and Physical, chart, labs and discussed the procedure including the risks, benefits and alternatives for the proposed anesthesia with the patient or authorized representative who has indicated his/her understanding and acceptance.       Plan Discussed with:   Anesthesia Plan Comments: (Epidural to be used for urgent C/S. Stephannie Peters, MD)       Anesthesia Quick Evaluation

## 2020-12-04 NOTE — Lactation Note (Signed)
This note was copied from a baby's chart. Lactation Consultation Note  Patient Name: Jocelyn Harrison Today's Date: 12/04/2020 Reason for consult: Follow-up assessment;Mother's request;Difficult latch;1st time breastfeeding;Term Age:29 hours P1, mom requested LC services for assistance with latching infant at the breast LC asked mom hand express prior to latching infant, mom latched infant on her right breast using the football hold position, infant latched with depth, sucking observed, infant was still breastfeeding after 8 minutes when LC left the room. LC discussed doing breast stimulation to keep infant awake to breast feed such as: breast compressions, gently stroking infant's neck and shoulder, talking to infant. Mom will try breastfeed infant on both breast during a feeding. Mom knows to call RN or LC for latch assistance if needed.  Mom understands to breastfeed infant according to cues, 8 to 12 + or more times within 24 hours , STS. Maternal Data Has patient been taught Hand Expression?: Yes Does the patient have breastfeeding experience prior to this delivery?: No  Feeding Mother's Current Feeding Choice: Breast Milk  LATCH Score Latch: Grasps breast easily, tongue down, lips flanged, rhythmical sucking.  Audible Swallowing: A few with stimulation  Type of Nipple: Everted at rest and after stimulation  Comfort (Breast/Nipple): Soft / non-tender  Hold (Positioning): Assistance needed to correctly position infant at breast and maintain latch.  LATCH Score: 8   Lactation Tools Discussed/Used Tools: Pump;Flanges Flange Size: 21 Breast pump type: Manual Pump Education: Setup, frequency, and cleaning Reason for Pumping: stimulation Pumping frequency: Q3  Interventions Interventions: Skin to skin;Support pillows;Adjust position;Breast compression;Position options;Expressed milk;Education  Discharge Pump: Personal;Manual WIC Program: No  Consult Status Consult Status:  Follow-up Date: 12/05/20 Follow-up type: In-patient    Danelle Earthly 12/04/2020, 7:10 PM

## 2020-12-04 NOTE — Progress Notes (Signed)
Brief progress note -  Prolonged deceleration to 80s, initially resolved after pitocin discontinued and terbutaline administered. Now having recurrent decelerations to 90s without contractions.   SVE unchanged 7cm  Discussed nonreassuring fetal heart tracing. Recommend urgent primary cesarean.   Reviewed risks of c-section with patient and her husband. Reviewed risk of infection, bleeding, damage to surrounding organs. Patient and husband agree with plan to proceed.   L&D team notified. Detailed note to be completed after delivery.   Alinda Deem, MD 12/04/20 11:23 AM

## 2020-12-04 NOTE — Brief Op Note (Signed)
12/03/2020 - 12/04/2020  12:19 PM  PATIENT:  Jocelyn Harrison  30 y.o. female at 19 w1d  PRE-OPERATIVE DIAGNOSIS:  nonreassuring fetal heart tracing  POST-OPERATIVE DIAGNOSIS:  nonreassuring fetal heart tracing  PROCEDURE:  Procedure(s): CESAREAN SECTION  SURGEON:  Surgeon(s) and Role:    * Brittaney Beaulieu, Terrance Mass, MD - Primary  ANESTHESIA:   epidural  EBL:    BLOOD ADMINISTERED:none  DRAINS: none   LOCAL MEDICATIONS USED:  NONE  SPECIMEN:  No Specimen  DISPOSITION OF SPECIMEN:  N/A  COUNTS:  YES  TOURNIQUET:  * No tourniquets in log *  DICTATION: .Note written in EPIC  PLAN OF CARE: Admit to inpatient   PATIENT DISPOSITION:  PACU - hemodynamically stable.   Delay start of Pharmacological VTE agent (>24hrs) due to surgical blood loss or risk of bleeding: yes

## 2020-12-04 NOTE — Anesthesia Postprocedure Evaluation (Signed)
Anesthesia Post Note  Patient: Jocelyn Harrison  Procedure(s) Performed: CESAREAN SECTION     Patient location during evaluation: PACU Anesthesia Type: Epidural Level of consciousness: awake and alert and oriented Pain management: pain level controlled Vital Signs Assessment: post-procedure vital signs reviewed and stable Respiratory status: spontaneous breathing, nonlabored ventilation and respiratory function stable Cardiovascular status: blood pressure returned to baseline Postop Assessment: no apparent nausea or vomiting Anesthetic complications: no   No complications documented.  Last Vitals:  Vitals:   12/04/20 1316 12/04/20 1324  BP: 103/60 (!) 102/51  Pulse: (!) 107   Resp: (!) 21   Temp:    SpO2:      Last Pain:  Vitals:   12/04/20 1321  TempSrc:   PainSc: 0-No pain   Pain Goal: Patients Stated Pain Goal: 0 (12/04/20 0752)  LLE Motor Response: Purposeful movement (12/04/20 1321) LLE Sensation: Decreased (12/04/20 1321) RLE Motor Response: Purposeful movement (12/04/20 1321) RLE Sensation: Decreased (12/04/20 1321)     Epidural/Spinal Function Cutaneous sensation: Tingles (12/04/20 1321), Patient able to flex knees: Yes (12/04/20 1321), Patient able to lift hips off bed: No (12/04/20 1321), Back pain beyond tenderness at insertion site: No (12/04/20 1321), Progressively worsening motor and/or sensory loss: No (12/04/20 1321), Bowel and/or bladder incontinence post epidural: No (12/04/20 1321)  Kaylyn Layer

## 2020-12-04 NOTE — Transfer of Care (Signed)
Immediate Anesthesia Transfer of Care Note  Patient: Dorleen Logan Bores  Procedure(s) Performed: CESAREAN SECTION  Patient Location: PACU  Anesthesia Type:Epidural  Level of Consciousness: awake, alert  and oriented  Airway & Oxygen Therapy: Patient Spontanous Breathing  Post-op Assessment: Report given to RN and Post -op Vital signs reviewed and stable  Post vital signs: Reviewed and stable  Last Vitals:  Vitals Value Taken Time  BP 88/45 12/04/20 1232  Temp    Pulse 98 12/04/20 1233  Resp    SpO2 99 % 12/04/20 1233  Vitals shown include unvalidated device data.  Last Pain:  Vitals:   12/04/20 1000  TempSrc: Axillary  PainSc:       Patients Stated Pain Goal: 0 (12/04/20 0752)  Complications: No complications documented.

## 2020-12-04 NOTE — Anesthesia Procedure Notes (Signed)
Epidural Patient location during procedure: OB Start time: 12/04/2020 8:54 AM End time: 12/04/2020 8:57 AM  Staffing Anesthesiologist: Kaylyn Layer, MD Performed: anesthesiologist   Preanesthetic Checklist Completed: patient identified, IV checked, risks and benefits discussed, monitors and equipment checked, pre-op evaluation and timeout performed  Epidural Patient position: sitting Prep: DuraPrep and site prepped and draped Patient monitoring: continuous pulse ox, blood pressure and heart rate Approach: midline Location: L3-L4 Injection technique: LOR air  Needle:  Needle type: Tuohy  Needle gauge: 17 G Needle length: 9 cm Catheter type: closed end flexible Catheter size: 19 Gauge Catheter at skin depth: 10 cm Test dose: negative and Other (1% lidocaine)  Assessment Events: blood not aspirated, injection not painful, no injection resistance, no paresthesia and negative IV test  Additional Notes Patient identified. Risks, benefits, and alternatives discussed with patient including but not limited to bleeding, infection, nerve damage, paralysis, failed block, incomplete pain control, headache, blood pressure changes, nausea, vomiting, reactions to medication, itching, and postpartum back pain. Confirmed with bedside nurse the patient's most recent platelet count. Confirmed with patient that they are not currently taking any anticoagulation, have any bleeding history, or any family history of bleeding disorders. Patient expressed understanding and wished to proceed. All questions were answered. Sterile technique was used throughout the entire procedure. Please see nursing notes for vital signs.   Crisp LOR on first pass. Test dose was given through epidural catheter and negative prior to continuing to dose epidural or start infusion. Warning signs of high block given to the patient including shortness of breath, tingling/numbness in hands, complete motor block, or any concerning  symptoms with instructions to call for help. Patient was given instructions on fall risk and not to get out of bed. All questions and concerns addressed with instructions to call with any issues or inadequate analgesia.  Reason for block:procedure for pain

## 2020-12-04 NOTE — Progress Notes (Signed)
Patient seen and examined.  Uncomfortable with LBP and uterine cramping  BP (!) 145/98   Pulse 87   Temp 98.1 F (36.7 C) (Oral)   Resp 19   Ht 5\' 3"  (1.6 m)   Wt 77 kg   LMP 03/04/2020   BMI 30.06 kg/m  Toco: uterine irritability EFM: 130s, moderate variability, category 1 SVE: 1/100/-1, foley bulb placed an filled with 62mL fluid  A/P: G1 @ [redacted]w[redacted]d with IOL for GHTN at term Will start low dose pitocin while foley bulb in place Epidural upon request

## 2020-12-04 NOTE — Lactation Note (Signed)
This note was copied from a baby's chart. Lactation Consultation Note  Patient Name: Jocelyn Harrison Today's Date: 12/04/2020 Reason for consult: Initial assessment;Term Age:29 hours  Initial visit to 5 hours old infant of a P1 mother. Both parents are present at the time of consult, father is supportive and engaged. Mother states infant was able to latch after the delivery. Parents explain it is time infant feeds again. LC observed infant showing hunger cues. Talked to parents about hand expression, demonstrated technique, collected a few drops and rubbed into infant's gums. Assisted mother with a football latch to right breast. Provided support with pillows.  Infant gets a shallow latch and does a couple of suckles dimpling cheeks. Infant fell asleep after a few minutes.  Attempted latch again, modified cradle position to left nipple without success.  LC talked about normal newborn behavior and expectations. Encouraged mother to use hand pump for stimulation. LC reviewed pumping frequency/storage and care of parts.   Plan:   1-Use hand pump for breast stimulation. 2-Breastfeeding on demand, ensuring a deep, comfortable latch potentially with NS.  3-Undressing infant and place skin to skin when ready to breastfeed 4-Keep infant awake during breastfeeding session: massaging breast, infant's hand/shoulder/feet 5-Monitor voids and stools as signs good intake.  6-Promoted maternal self care 7-Contact LC as needed for feeds/support/concerns/questions  All question answered at this time. Provided LC services brochure.   Maternal Data Has patient been taught Hand Expression?: Yes Does the patient have breastfeeding experience prior to this delivery?: No  Feeding Mother's Current Feeding Choice: Breast Milk  LATCH Score Latch: Repeated attempts needed to sustain latch, nipple held in mouth throughout feeding, stimulation needed to elicit sucking reflex.  Audible Swallowing: None  Type of  Nipple: Everted at rest and after stimulation (short shafted)  Comfort (Breast/Nipple): Soft / non-tender  Hold (Positioning): Assistance needed to correctly position infant at breast and maintain latch.  LATCH Score: 6   Lactation Tools Discussed/Used Tools: Pump;Flanges Flange Size: 21 Breast pump type: Manual Pump Education: Setup, frequency, and cleaning Reason for Pumping: stimulation Pumping frequency: Q3  Interventions Interventions: Breast feeding basics reviewed;Assisted with latch;Skin to skin;Breast massage;Hand express;Adjust position;Support pillows;Position options;Expressed milk;Hand pump;Education  Discharge Pump: Personal;Manual WIC Program: No  Consult Status Consult Status: Follow-up Date: 12/05/20 Follow-up type: In-patient    Jocelyn Harrison 12/04/2020, 4:41 PM

## 2020-12-05 ENCOUNTER — Encounter (HOSPITAL_COMMUNITY): Payer: Self-pay | Admitting: Obstetrics

## 2020-12-05 LAB — CBC
HCT: 30.9 % — ABNORMAL LOW (ref 36.0–46.0)
Hemoglobin: 10.9 g/dL — ABNORMAL LOW (ref 12.0–15.0)
MCH: 34.3 pg — ABNORMAL HIGH (ref 26.0–34.0)
MCHC: 35.3 g/dL (ref 30.0–36.0)
MCV: 97.2 fL (ref 80.0–100.0)
Platelets: 168 10*3/uL (ref 150–400)
RBC: 3.18 MIL/uL — ABNORMAL LOW (ref 3.87–5.11)
RDW: 12.2 % (ref 11.5–15.5)
WBC: 16.6 10*3/uL — ABNORMAL HIGH (ref 4.0–10.5)
nRBC: 0 % (ref 0.0–0.2)

## 2020-12-05 LAB — BIRTH TISSUE RECOVERY COLLECTION (PLACENTA DONATION)

## 2020-12-05 NOTE — Lactation Note (Signed)
This note was copied from a baby's chart. Lactation Consultation Note  Patient Name: Jocelyn Harrison Today's Date: 12/05/2020 Reason for consult: Primapara;Term;Follow-up assessment;1st time breastfeeding Age:29 hours   P1 mother whose infant is now 17 hours old.  This is a term baby at 39+1 weeks.  NT in room finishing baby's bath when I arrived.  Offered to assist with latching and mother agreeable.  Mother concerned that she cannot express colostrum and is worried that baby is not feeding very often.  Basic breast feeding education completed and reassured mother that this is typical behavior for a baby at this age.  Asked mother to demonstrate hand expression.  With revised technique, she was able to express a few colostrum drops which I finger fed back to baby.  Assisted to latch in the football hold on the left breast easily.  Baby fed for 7 minutes with gentle stimulation.  Placed her STS on mother's chest after feeding and she was calm.  Mother will continue to feed 8-12 times/24 hours or sooner if baby shows cues.  Reviewed cues per father's request.  Discussed using EBM and coconut oil for nipple comfort.  Parents appreciative of help provided.  Mother may call again for further latch assistance.     Maternal Data Has patient been taught Hand Expression?: Yes Does the patient have breastfeeding experience prior to this delivery?: No  Feeding Mother's Current Feeding Choice: Breast Milk  LATCH Score Latch: Repeated attempts needed to sustain latch, nipple held in mouth throughout feeding, stimulation needed to elicit sucking reflex.  Audible Swallowing: None  Type of Nipple: Everted at rest and after stimulation  Comfort (Breast/Nipple): Soft / non-tender  Hold (Positioning): Assistance needed to correctly position infant at breast and maintain latch.  LATCH Score: 6   Lactation Tools Discussed/Used    Interventions Interventions: Breast feeding basics  reviewed;Assisted with latch;Skin to skin;Breast massage;Hand express;Adjust position;Breast compression;Coconut oil;Position options;Support pillows;Education  Discharge    Consult Status Consult Status: Follow-up Date: 12/06/20 Follow-up type: In-patient    Griffin Gerrard R Floyde Dingley 12/05/2020, 6:13 AM

## 2020-12-05 NOTE — Progress Notes (Signed)
  Patient is eating, ambulating, voiding.  Pain control is good.  Vitals:   12/04/20 2145 12/05/20 0000 12/05/20 0200 12/05/20 0400  BP:    110/68  Pulse:    80  Resp: 17 16 17 16   Temp:  98.3 F (36.8 C)  98.6 F (37 C)  TempSrc:  Oral  Oral  SpO2: 98% 99% 99% 98%  Weight:      Height:        lungs:   clear to auscultation cor:    RRR Abdomen:  soft, appropriate tenderness, incisions intact and without erythema or exudate ex:    no cords   Lab Results  Component Value Date   WBC 16.6 (H) 12/05/2020   HGB 10.9 (L) 12/05/2020   HCT 30.9 (L) 12/05/2020   MCV 97.2 12/05/2020   PLT 168 12/05/2020    --/--/O POS (04/06 1514)/RI  A/P    Post operative day 1.  Routine post op and postpartum care.  Expect d/c tomorrow.  Percocet for pain control.

## 2020-12-06 MED ORDER — OXYCODONE-ACETAMINOPHEN 5-325 MG PO TABS: 1.0000 | ORAL_TABLET | ORAL | 0 refills | Status: AC | PRN

## 2020-12-06 MED ORDER — IBUPROFEN 800 MG PO TABS: 800.0000 mg | ORAL_TABLET | Freq: Four times a day (QID) | ORAL | 0 refills | Status: AC

## 2020-12-06 NOTE — Discharge Summary (Signed)
Postpartum Discharge Summary  Date of Service updated      Patient Name: Jocelyn Harrison DOB: [birth year] MRN: [id number]  Date of admission: 12/03/2020 Delivery date:12/04/2020  Delivering provider: Irene Pap E  Date of discharge: 12/06/2020  Admitting diagnosis: Gestational hypertension w/o significant proteinuria in 3rd trimester [O13.3] Intrauterine pregnancy: [redacted]w[redacted]d    Secondary diagnosis:  Active Problems:   Gestational hypertension w/o significant proteinuria in 3rd trimester  Additional problems: gestational hypertension    Discharge diagnosis: Term Pregnancy Delivered                                              Post partum procedures:none Augmentation: AROM, Pitocin, Cytotec and IP Foley Complications: None  Hospital course: Induction of Labor With Cesarean Section   30y.o. yo G1P1001 at 369w1das admitted to the hospital 12/03/2020 for induction of labor. Patient had a labor course significant for nonreassuring FHTS. The patient went for cesarean section due to Non-Reassuring FHR. Delivery details are as follows: Membrane Rupture Time/Date: 3:45 AM ,12/04/2020   Delivery Method:C-Section, Low Transverse  Details of operation can be found in separate operative Note.  Patient had an uncomplicated postpartum course. She is ambulating, tolerating a regular diet, passing flatus, and urinating well.  Patient is discharged home in stable condition on 12/06/20.      Newborn Data: Birth date:12/04/2020  Birth time:11:40 AM  Gender:Female  Living status:Living  Apgars:9 ,9  Weight:2730 g                                 Magnesium Sulfate received: No BMZ received: No Rhophylac:No MMR:No T-DaP:Given prenatally Flu: No Transfusion:No  Physical exam  Vitals:   12/05/20 1238 12/05/20 1509 12/05/20 2120 12/06/20 0737  BP:  123/70 (!) 128/91 133/86  Pulse: 91 89 93 88  Resp: _0 Temp: 97.6 F (36.4 C) 97.8 F (36.6 C) 98.3 F (36.8 C) 98.2 F (36.8 C)   TempSrc: Oral Axillary Axillary Axillary  SpO2: 99% 100% 99% 99%  Weight:      Height:        Labs: Lab Results  Component Value Date   WBC 16.6 (H) 12/05/2020   HGB 10.9 (L) 12/05/2020   HCT 30.9 (L) 12/05/2020   MCV 97.2 12/05/2020   PLT 168 12/05/2020   CMP Latest Ref Rng & Units 12/03/2020  Glucose 70 - 99 mg/dL 101(H)  BUN 6 - 20 mg/dL 10  Creatinine 0.44 - 1.00 mg/dL 0.40(L)  Sodium 135 - 145 mmol/L 133(L)  Potassium 3.5 - 5.1 mmol/L 3.7  Chloride 98 - 111 mmol/L 104  CO2 22 - 32 mmol/L 22  Calcium 8.9 - 10.3 mg/dL 9.2  Total Protein 6.5 - 8.1 g/dL 6.1(L)  Total Bilirubin 0.3 - 1.2 mg/dL 0.4  Alkaline Phos 38 - 126 U/L 155(H)  AST 15 - 41 U/L 19  ALT 0 - 44 U/L 17   Edinburgh Score: Edinburgh Postnatal Depression Scale Screening Tool 12/04/2020  I have been able to laugh and see the funny side of things. 0  I have looked forward with enjoyment to things. 0  I have blamed myself unnecessarily when things went wrong. 2  I have been anxious or worried for no good reason. 2  I have felt  scared or panicky for no good reason. 0  Things have been getting on top of me. 2  I have been so unhappy that I have had difficulty sleeping. 0  I have felt sad or miserable. 0  I have been so unhappy that I have been crying. 0  The thought of harming myself has occurred to me. 0  Edinburgh Postnatal Depression Scale Total 6      After visit meds:  Allergies as of 12/06/2020      Reactions   Lactose Intolerance (gi) Diarrhea, Nausea Only      Medication List    TAKE these medications   cholecalciferol 25 MCG (1000 UNIT) tablet Commonly known as: VITAMIN D3 Take 1,000 Units by mouth at bedtime.   famotidine 20 MG tablet Commonly known as: PEPCID Take 20 mg by mouth at bedtime.   ibuprofen 800 MG tablet Commonly known as: ADVIL Take 1 tablet (800 mg total) by mouth every 6 (six) hours.   oxyCODONE-acetaminophen 5-325 MG tablet Commonly known as: PERCOCET/ROXICET Take  1-2 tablets by mouth every 4 (four) hours as needed for moderate pain.   polyethylene glycol 17 g packet Commonly known as: MIRALAX / GLYCOLAX Take 17 g by mouth daily as needed for mild constipation.   prenatal multivitamin Tabs tablet Take 1 tablet by mouth at bedtime.        Discharge home in stable condition Infant Feeding: ? Infant Disposition:home with mother Discharge instruction: per After Visit Summary and Postpartum booklet. Activity: Advance as tolerated. Pelvic rest for 6 weeks.  Diet: low salt diet Anticipated Birth Control: Unsure Postpartum Appointment:4 weeks Additional Postpartum F/U: none Future Appointments:No future appointments. Follow up Visit:  Follow-up Information    Marinone, Michelle E, MD Follow up in 4 week(s).   Specialty: Obstetrics and Gynecology Contact information: [address] [place] [address] [phone]                   12/06/2020 Michelle A Horvath, MD  

## 2020-12-06 NOTE — Lactation Note (Incomplete)
This note was copied from a baby's chart. Lactation Consultation Note  Patient Name: Jocelyn Harrison Today's Date: 12/06/2020 Reason for consult: Follow-up assessment;Mother's request;1st time breastfeeding Age:29 hours  P1, Mother states she had breast + changes during pregnancy and can easily hand express transitional breastmilk. Baby has been sleepy this morning and difficulty waking.  Reviewed waking techniques. Baby latched in both cross cradle and football hold off and on for approx 8 min. Mother hand expressed drops which were given to baby on spoon. Set up DEBP and mother pumped. Suggest pumping q 3 hours and giving volume back to baby if baby continues to be sleepy at the breast.     Feeding Mother's Current Feeding Choice: Breast Milk  LATCH Score Latch: Repeated attempts needed to sustain latch, nipple held in mouth throughout feeding, stimulation needed to elicit sucking reflex.  Audible Swallowing: A few with stimulation  Type of Nipple: Everted at rest and after stimulation  Comfort (Breast/Nipple): Filling, red/small blisters or bruises, mild/mod discomfort  Hold (Positioning): Assistance needed to correctly position infant at breast and maintain latch.  LATCH Score: 6  Lactation Tools Discussed/Used Tools: Pump Flange Size: 21 Breast pump type: Double-Electric Breast Pump Pump Education: Setup, frequency, and cleaning;Milk Storage Reason for Pumping: stimulation and supplemenation Pumping frequency: q 3 hours  Interventions Interventions: Breast feeding basics reviewed;Assisted with latch;Skin to skin;Hand express;Breast compression;Adjust position;Support pillows;DEBP;Education  Discharge Pump: DEBP  Consult Status Consult Status: Follow-up Date: 12/07/20 Follow-up type: In-patient   Jocelyn Harrison [facility] 12/06/2020, 9:46 AM

## 2020-12-06 NOTE — Progress Notes (Addendum)
  Patient is eating, ambulating, voiding.  Pain control is good.  Vitals:   12/05/20 1238 12/05/20 1509 12/05/20 2120 12/06/20 0737  BP:  123/70 (!) 128/91 133/86  Pulse: 91 89 93 88  Resp: 19 18 18 18   Temp: 97.6 F (36.4 C) 97.8 F (36.6 C) 98.3 F (36.8 C) 98.2 F (36.8 C)  TempSrc: Oral Axillary Axillary Axillary  SpO2: 99% 100% 99% 99%  Weight:      Height:        lungs:   clear to auscultation cor:    RRR Abdomen:  soft, appropriate tenderness, incisions intact and without erythema or exudate ex:    no cords   Lab Results  Component Value Date   WBC 16.6 (H) 12/05/2020   HGB 10.9 (L) 12/05/2020   HCT 30.9 (L) 12/05/2020   MCV 97.2 12/05/2020   PLT 168 12/05/2020    --/--/O POS (04/06 1514)/RI  A/P    Post operative day 2.  Routine post op and postpartum care.  Expect d/c today.  BPs have all been in mostly normal range.  Percocet for pain control.

## 2020-12-07 ENCOUNTER — Encounter (HOSPITAL_COMMUNITY): Payer: Self-pay | Admitting: Obstetrics and Gynecology

## 2020-12-11 ENCOUNTER — Telehealth (HOSPITAL_COMMUNITY): Payer: Self-pay | Admitting: Lactation Services

## 2020-12-11 NOTE — Telephone Encounter (Signed)
This mom called in reference to her week old baby. Per mom started out breast feeding and when she went for her 1st weight check the weight was down enough and the Pedis MD recommended supplementing and she was doing both breast feeding and bottle feeding. Baby is now not latching. Mom has been pumping every 3 hours except at night.  LC recommended - burping the baby prior to feeding at the breast and bottle feeding , give and appetizer of EBM or formula 10 ml and then try to latch.  Whether baby latches or not pump both breast until volume increases.  Mom mentioned when she pumps she only gets 30 ml.  LC recommended calling for [facility] O/P appt for next Monday,Tuesday or Wednesday. Mom already has the number and plans to do so.  LC encouraged mom to call if further questions.

## 2023-03-08 ENCOUNTER — Other Ambulatory Visit: Payer: Self-pay

## 2023-03-08 ENCOUNTER — Emergency Department (HOSPITAL_BASED_OUTPATIENT_CLINIC_OR_DEPARTMENT_OTHER)
Admission: EM | Admit: 2023-03-08 | Discharge: 2023-03-08 | Disposition: A | Discharge status: Home / Self Care | Payer: Federal, State, Local not specified - PPO | Attending: Emergency Medicine | Admitting: Emergency Medicine

## 2023-03-08 ENCOUNTER — Other Ambulatory Visit (HOSPITAL_BASED_OUTPATIENT_CLINIC_OR_DEPARTMENT_OTHER): Payer: Self-pay

## 2023-03-08 ENCOUNTER — Encounter (HOSPITAL_BASED_OUTPATIENT_CLINIC_OR_DEPARTMENT_OTHER): Payer: Self-pay | Admitting: Emergency Medicine

## 2023-03-08 ENCOUNTER — Emergency Department (HOSPITAL_BASED_OUTPATIENT_CLINIC_OR_DEPARTMENT_OTHER): Payer: Federal, State, Local not specified - PPO | Admitting: Radiology

## 2023-03-08 DIAGNOSIS — R0602 Shortness of breath: Secondary | ICD-10-CM | POA: Diagnosis present

## 2023-03-08 DIAGNOSIS — U071 COVID-19: Secondary | ICD-10-CM | POA: Diagnosis not present

## 2023-03-08 LAB — CBC WITH DIFFERENTIAL/PLATELET
Abs Immature Granulocytes: 0.01 10*3/uL (ref 0.00–0.07)
Basophils Absolute: 0 10*3/uL (ref 0.0–0.1)
Basophils Relative: 1 %
Eosinophils Absolute: 0 10*3/uL (ref 0.0–0.5)
Eosinophils Relative: 1 %
HCT: 38.8 % (ref 36.0–46.0)
Hemoglobin: 14 g/dL (ref 12.0–15.0)
Immature Granulocytes: 0 %
Lymphocytes Relative: 24 %
Lymphs Abs: 1.3 10*3/uL (ref 0.7–4.0)
MCH: 32.5 pg (ref 26.0–34.0)
MCHC: 36.1 g/dL — ABNORMAL HIGH (ref 30.0–36.0)
MCV: 90 fL (ref 80.0–100.0)
Monocytes Absolute: 0.6 10*3/uL (ref 0.1–1.0)
Monocytes Relative: 11 %
Neutro Abs: 3.4 10*3/uL (ref 1.7–7.7)
Neutrophils Relative %: 63 %
Platelets: 278 10*3/uL (ref 150–400)
RBC: 4.31 MIL/uL (ref 3.87–5.11)
RDW: 11.8 % (ref 11.5–15.5)
WBC: 5.4 10*3/uL (ref 4.0–10.5)
nRBC: 0 % (ref 0.0–0.2)

## 2023-03-08 LAB — URINALYSIS, ROUTINE W REFLEX MICROSCOPIC
Bacteria, UA: NONE SEEN
Bilirubin Urine: NEGATIVE
Glucose, UA: NEGATIVE mg/dL
Ketones, ur: NEGATIVE mg/dL
Leukocytes,Ua: NEGATIVE
Nitrite: NEGATIVE
Protein, ur: NEGATIVE mg/dL
Specific Gravity, Urine: 1.005 (ref 1.005–1.030)
pH: 6.5 (ref 5.0–8.0)

## 2023-03-08 LAB — BASIC METABOLIC PANEL
Anion gap: 9 (ref 5–15)
BUN: 9 mg/dL (ref 6–20)
CO2: 26 mmol/L (ref 22–32)
Calcium: 10.6 mg/dL — ABNORMAL HIGH (ref 8.9–10.3)
Chloride: 101 mmol/L (ref 98–111)
Creatinine, Ser: 0.56 mg/dL (ref 0.44–1.00)
GFR, Estimated: >60 mL/min (ref >60)
Glucose, Bld: 101 mg/dL — ABNORMAL HIGH (ref 70–99)
Potassium: 3.5 mmol/L (ref 3.5–5.1)
Sodium: 136 mmol/L (ref 135–145)

## 2023-03-08 LAB — D-DIMER, QUANTITATIVE: D-Dimer, Quant: <0.27 ug/mL-FEU (ref 0.00–0.50)

## 2023-03-08 LAB — SARS CORONAVIRUS 2 BY RT PCR: SARS Coronavirus 2 by RT PCR: POSITIVE — AB

## 2023-03-08 LAB — PREGNANCY, URINE: Preg Test, Ur: NEGATIVE

## 2023-03-08 MED ORDER — LACTATED RINGERS IV BOLUS
500.0000 mL | Freq: Once | INTRAVENOUS | Status: AC
  Administered 2023-03-08: 500 mL via INTRAVENOUS

## 2023-03-08 MED ORDER — ACETAMINOPHEN 500 MG PO TABS
1000.0000 mg | ORAL_TABLET | Freq: Once | ORAL | Status: AC
  Administered 2023-03-08: 1000 mg via ORAL
  Filled 2023-03-08: qty 2

## 2023-03-08 MED ORDER — ALBUTEROL SULFATE HFA 108 (90 BASE) MCG/ACT IN AERS
2.0000 | INHALATION_SPRAY | RESPIRATORY_TRACT | Status: DC | PRN
  Administered 2023-03-08: 2 via RESPIRATORY_TRACT
  Filled 2023-03-08: qty 6.7

## 2023-03-08 MED ORDER — ALBUTEROL SULFATE HFA 108 (90 BASE) MCG/ACT IN AERS
2.0000 | INHALATION_SPRAY | RESPIRATORY_TRACT | 0 refills | Status: AC | PRN
  Filled 2023-03-08: qty 6.7, 17d supply, fill #0

## 2023-03-08 NOTE — ED Triage Notes (Signed)
Pt arrived POV, recently around someone with COVID on Saturday, daughter being tested for Norovirus. C/o SOB, pressure behind nose, generalized body aches, back pain. Treated for double ear infection 2 weeks ago and didn't finish ABX.

## 2023-03-08 NOTE — ED Provider Notes (Signed)
[place] EMERGENCY DEPARTMENT AT [facility] Provider Note   CSN: [id number] Arrival date & time: 03/08/23  0745     History  Chief Complaint  Patient presents with   Shortness of Breath    Jocelyn Harrison is a 32 y.o. female.   Shortness of Breath  Previously healthy 32 year old female presenting for shortness of breath.  Patient states she recent was around someone with COVID on Saturday.  Her daughter is also being tested for norovirus.  Patient yesterday had a mild sinus headache and pressure.  Today developed some shortness of breath around 5 AM, body aches, paraspinal lumbar back pain.  No trauma.  No fevers or chills.  No vomiting or diarrhea.  She is eating and drinking well.  She has not had any chest pain or pleuritic pain.  No history of DVT or PE or recent immobilization.  She is not on any medications.  No concern for pregnancy.     Home Medications Prior to Admission medications   Medication Sig Start Date End Date Taking? Authorizing Provider  albuterol (VENTOLIN HFA) 108 (90 Base) MCG/ACT inhaler Inhale 2 puffs into the lungs every 4 (four) hours as needed for wheezing or shortness of breath. 03/08/23  Yes Laurence Spates, MD  cholecalciferol (VITAMIN D3) 25 MCG (1000 UNIT) tablet Take 1,000 Units by mouth at bedtime.    [provider]  famotidine (PEPCID) 20 MG tablet Take 20 mg by mouth at bedtime.    [provider]  ibuprofen (ADVIL) 800 MG tablet Take 1 tablet (800 mg total) by mouth every 6 (six) hours. 12/06/20   Carrington Clamp, MD  oxyCODONE-acetaminophen (PERCOCET/ROXICET) 5-325 MG tablet Take 1-2 tablets by mouth every 4 (four) hours as needed for moderate pain. 12/06/20   Carrington Clamp, MD  polyethylene glycol (MIRALAX / GLYCOLAX) 17 g packet Take 17 g by mouth daily as needed for mild constipation.    [provider]  Prenatal Vit-Fe Fumarate-FA (PRENATAL MULTIVITAMIN) TABS tablet Take 1 tablet by mouth at bedtime.     [provider]      Allergies    Lactose intolerance (gi)    Review of Systems   Review of Systems  Respiratory:  Positive for shortness of breath.    Review of systems completed and notable as per HPI.  ROS otherwise negative.   Physical Exam Updated Vital Signs BP (!) 117/105   Pulse (!) 106   Temp 98.6 F (37 C) (Oral)   Resp (!) 21   Ht 5\' 3"  (1.6 m)   Wt 69.4 kg   LMP 03/07/2023   SpO2 100%   BMI 27.10 kg/m  Physical Exam Vitals and nursing note reviewed.  Constitutional:      General: She is not in acute distress.    Appearance: She is well-developed.  HENT:     Head: Normocephalic and atraumatic.  Eyes:     Conjunctiva/sclera: Conjunctivae normal.  Cardiovascular:     Rate and Rhythm: Normal rate and regular rhythm.     Heart sounds: No murmur heard. Pulmonary:     Effort: Pulmonary effort is normal. Tachypnea present. No respiratory distress.     Breath sounds: Normal breath sounds.     Comments: Mild tachypnea but no retractions or increased work of breathing. Abdominal:     Palpations: Abdomen is soft.     Tenderness: There is no abdominal tenderness.  Musculoskeletal:        General: No swelling.  Cervical back: Neck supple.     Right lower leg: No tenderness. No edema.     Left lower leg: No tenderness. No edema.  Skin:    General: Skin is warm and dry.     Capillary Refill: Capillary refill takes less than 2 seconds.  Neurological:     Mental Status: She is alert.  Psychiatric:        Mood and Affect: Mood normal.     ED Results / Procedures / Treatments   Labs (all labs ordered are listed, but only abnormal results are displayed) Labs Reviewed  SARS CORONAVIRUS 2 BY RT PCR - Abnormal; Notable for the following components:      Result Value   SARS Coronavirus 2 by RT PCR POSITIVE (*)    All other components within normal limits  CBC WITH DIFFERENTIAL/PLATELET - Abnormal; Notable for the following components:   MCHC 36.1  (*)    All other components within normal limits  BASIC METABOLIC PANEL - Abnormal; Notable for the following components:   Glucose, Bld 101 (*)    Calcium 10.6 (*)    All other components within normal limits  URINALYSIS, ROUTINE W REFLEX MICROSCOPIC - Abnormal; Notable for the following components:   Hgb urine dipstick MODERATE (*)    All other components within normal limits  D-DIMER, QUANTITATIVE  PREGNANCY, URINE    EKG EKG Interpretation Date/Time:  Tuesday March 08 2023 07:57:47 EDT Ventricular Rate:  103 PR Interval:  121 QRS Duration:  81 QT Interval:  326 QTC Calculation: 427 R Axis:   55  Text Interpretation: Sinus tachycardia Confirmed by Alona Bene [phone]) on 03/08/2023 9:12:47 AM  Radiology DG Chest 2 View  Result Date: 03/08/2023 CLINICAL DATA:  Shortness of breath EXAM: CHEST - 2 VIEW COMPARISON:  None Available. FINDINGS: The heart size and mediastinal contours are within normal limits. No focal pulmonary opacity. No pleural effusion or pneumothorax. The visualized upper abdomen is unremarkable. No acute osseous abnormality. IMPRESSION: No active cardiopulmonary disease. Electronically Signed   By: Jacob Moores M.D.   On: 03/08/2023 08:18    Procedures Procedures    Medications Ordered in ED Medications  albuterol (VENTOLIN HFA) 108 (90 Base) MCG/ACT inhaler 2 puff (2 puffs Inhalation Given 03/08/23 0817)  lactated ringers bolus 500 mL (500 mLs Intravenous New Bag/Given 03/08/23 0840)  acetaminophen (TYLENOL) tablet 1,000 mg (1,000 mg Oral Given 03/08/23 6045)    ED Course/ Medical Decision Making/ A&P                             Medical Decision Making Amount and/or Complexity of Data Reviewed Labs: ordered. Radiology: ordered.  Risk OTC drugs.   Medical Decision Making:   Jocelyn Harrison is a 32 y.o. female who presented to the ED today with shortness of breath, URI symptoms detailed above.    Patient placed on continuous vitals and telemetry  monitoring while in ED which was reviewed periodically.  Reviewed and confirmed nursing documentation for past medical history, family history, social history.    Initial Assessment and plan:   Patient presenting with 1 day of sinus congestion, shortness of breath, body aches.  Vital signs reviewed.  She has some mild intermittent tachycardia and tachypnea but clear lung sounds bilaterally normal work of breathing.  No fevers.  I suspect her symptoms are related to viral infection given recent sick contacts with congestion.  However given mild tachycardia, shortness  of breath will obtain D-dimer to evaluate for possible PE as well as chest x-ray to evaluate for pneumonia.   Initial Study Results:   Laboratory  All laboratory results reviewed.  Labs notable for unremarkable CBC, BMP notable for mildly elevated calcium.  D-dimer is negative, reassuring against PE.  COVID is positive consistent with her symptoms.  Urinalysis with some hematuria, she is on her menstrual period.  No signs of infection.  Pregnancy is negative.  EKG EKG was reviewed independently.  EKG sinus tachycardia rate 103, otherwise unremarkable.  Isolated T wave version lead III, no significant ST changes or signs of ischemia.  Radiology:  All images reviewed independently. Agree with radiology report at this time.   Chest x-ray reviewed, normal cardiac silhouette.  No pneumothorax pneumonia, pulmonary edema. DG Chest 2 View  Result Date: 03/08/2023 CLINICAL DATA:  Shortness of breath EXAM: CHEST - 2 VIEW COMPARISON:  None Available. FINDINGS: The heart size and mediastinal contours are within normal limits. No focal pulmonary opacity. No pleural effusion or pneumothorax. The visualized upper abdomen is unremarkable. No acute osseous abnormality. IMPRESSION: No active cardiopulmonary disease. Electronically Signed   By: Jacob Moores M.D.   On: 03/08/2023 08:18     Reassessment and Plan:   On reassessment, patient is  feeling better.  Tachypnea has resolved, heart rate in the low 100s.  She was given albuterol via respiratory therapy.  I do not hear any wheezing but she does report some improvement in breathing with this.  No history of asthma, will give her inhaler to use at home and recommend she follow close with her PCP for further evaluation and reassessment.  Given reassuring workup and presentation consistent with uncomplicated COVID infection, I think she is appropriate for discharge home.  Strict return precautions were given.  Discussed isolation and return to work.        Final Clinical Impression(s) / ED Diagnoses Final diagnoses:  COVID    Rx / DC Orders ED Discharge Orders          Ordered    albuterol (VENTOLIN HFA) 108 (90 Base) MCG/ACT inhaler  Every 4 hours PRN        03/08/23 0924              Laurence Spates, MD 03/08/23 [phone]

## 2023-03-08 NOTE — Discharge Instructions (Signed)
You were diagnosed with COVID.  The CDC recommends isolation for at least 5 days before returning to work or school.  You should be fever free for at least 24 hours without the use of fever reducing medications with improving symptoms before returning to work or school as well.  You should follow-up with a primary care provider.  If you develop chest pain, worsening difficulty breathing or any other new concerning symptoms you should return to the ED.  Your lab work showed mild elevation in your calcium, you should follow-up with your primary care provider to recheck this.

## 2023-03-08 NOTE — ED Notes (Signed)
Pt discharged to home using teachback Method. Discharge instructions have been discussed with patient and/or family members. Pt verbally acknowledges understanding d/c instructions, has been given opportunity for questions to be answered, and endorses comprehension to checkout at registration before leaving.  

## 2023-03-21 ENCOUNTER — Other Ambulatory Visit (HOSPITAL_BASED_OUTPATIENT_CLINIC_OR_DEPARTMENT_OTHER): Payer: Self-pay
